# Patient Record
Sex: Male | Born: 1965
Health system: Southern US, Community
[De-identification: ages and names within clinical notes are randomized; demographics above are authoritative.]

## PROBLEM LIST (undated history)

## (undated) DIAGNOSIS — N200 Calculus of kidney: Secondary | ICD-10-CM

## (undated) DIAGNOSIS — K219 Gastro-esophageal reflux disease without esophagitis: Secondary | ICD-10-CM

## (undated) HISTORY — DX: Gastro-esophageal reflux disease without esophagitis: K21.9

## (undated) HISTORY — DX: Calculus of kidney: N20.0

## (undated) HISTORY — PX: OTHER SURGICAL HISTORY: SHX169

---

## 2001-02-17 ENCOUNTER — Other Ambulatory Visit: Admission: RE | Admit: 2001-02-17 | Discharge: 2001-02-17 | Payer: Self-pay | Admitting: Urology

## 2001-11-01 ENCOUNTER — Encounter: Payer: Self-pay | Admitting: Internal Medicine

## 2001-11-01 ENCOUNTER — Emergency Department (HOSPITAL_COMMUNITY): Admission: EM | Admit: 2001-11-01 | Discharge: 2001-11-01 | Payer: Self-pay | Admitting: Internal Medicine

## 2002-02-23 ENCOUNTER — Ambulatory Visit (HOSPITAL_COMMUNITY): Admission: RE | Admit: 2002-02-23 | Discharge: 2002-02-23 | Payer: Self-pay | Admitting: Family Medicine

## 2002-02-23 ENCOUNTER — Encounter: Payer: Self-pay | Admitting: Family Medicine

## 2011-06-14 ENCOUNTER — Encounter: Payer: Self-pay | Admitting: Gastroenterology

## 2013-03-23 ENCOUNTER — Ambulatory Visit: Payer: Self-pay | Admitting: Family Medicine

## 2013-04-25 ENCOUNTER — Telehealth: Payer: Self-pay | Admitting: Family Medicine

## 2013-04-25 NOTE — Telephone Encounter (Signed)
Patient would like to know if Dr. Brett Canales would like for him to get blood work before coming in on Friday 04/27/2013.  Please call Patient. Thanks

## 2013-04-27 ENCOUNTER — Ambulatory Visit (INDEPENDENT_AMBULATORY_CARE_PROVIDER_SITE_OTHER): Payer: PRIVATE HEALTH INSURANCE | Admitting: Family Medicine

## 2013-04-27 ENCOUNTER — Encounter: Payer: Self-pay | Admitting: Family Medicine

## 2013-04-27 VITALS — BP 132/94 | Ht 66.0 in | Wt 205.0 lb

## 2013-04-27 DIAGNOSIS — M549 Dorsalgia, unspecified: Secondary | ICD-10-CM

## 2013-04-27 DIAGNOSIS — Z79899 Other long term (current) drug therapy: Secondary | ICD-10-CM

## 2013-04-27 DIAGNOSIS — Z0000 Encounter for general adult medical examination without abnormal findings: Secondary | ICD-10-CM

## 2013-04-27 DIAGNOSIS — Z125 Encounter for screening for malignant neoplasm of prostate: Secondary | ICD-10-CM

## 2013-04-27 MED ORDER — CHLORZOXAZONE 500 MG PO TABS: 500.0000 mg | ORAL_TABLET | Freq: Three times a day (TID) | ORAL | Status: DC | PRN

## 2013-04-27 MED ORDER — DICLOFENAC SODIUM 75 MG PO TBEC: 75.0000 mg | DELAYED_RELEASE_TABLET | Freq: Two times a day (BID) | ORAL | Status: DC

## 2013-04-27 NOTE — Progress Notes (Signed)
  Subjective:    Patient ID: James Barr, male    DOB: [birth year], 47 y.o.   MRN: [id number]  Back Pain This is a new problem. The current episode started more than 1 month ago. The problem has been gradually worsening since onset. The pain is present in the lumbar spine. The pain radiates to the left thigh. The pain is at a severity of 6/10. The pain is moderate. The pain is worse during the night. Treatments tried: advil prn aleave. The treatment provided mild relief.      Review of Systems  Musculoskeletal: Positive for back pain.   ROS otherwise negative     Objective:   Physical Exam  Alert no acute distress. Vitals reviewed. Lungs clear. Heart regular rate and rhythm. Positive paralumbar tenderness to deep palpation negative straight leg raise      Assessment & Plan:  Impression lumbar strain discussed at length. With duration greater than one month in nature will do an x-ray discussed has not had any recent blood work or while medicine exam. Plan Voltaren and chlorzoxazone when necessary. Appropriate x-ray. Local measures discussed. Appropriate blood work. Recheck in month for wellness exam. WSL

## 2013-05-01 DIAGNOSIS — M549 Dorsalgia, unspecified: Secondary | ICD-10-CM | POA: Insufficient documentation

## 2013-05-03 ENCOUNTER — Encounter: Payer: Self-pay | Admitting: *Deleted

## 2013-05-19 LAB — HEPATIC FUNCTION PANEL
ALT: 27 U/L (ref 0–53)
AST: 20 U/L (ref 0–37)
Albumin: 3.9 g/dL (ref 3.5–5.2)
Alkaline Phosphatase: 62 U/L (ref 39–117)
Bilirubin, Direct: 0.1 mg/dL (ref 0.0–0.3)
Indirect Bilirubin: 0.3 mg/dL (ref 0.0–0.9)
Total Bilirubin: 0.4 mg/dL (ref 0.3–1.2)
Total Protein: 6.7 g/dL (ref 6.0–8.3)

## 2013-05-19 LAB — BASIC METABOLIC PANEL
BUN: 15 mg/dL (ref 6–23)
CO2: 24 mEq/L (ref 19–32)
Calcium: 8.6 mg/dL (ref 8.4–10.5)
Chloride: 107 mEq/L (ref 96–112)
Creat: 1.03 mg/dL (ref 0.50–1.35)
Glucose, Bld: 95 mg/dL (ref 70–99)
Potassium: 4.4 mEq/L (ref 3.5–5.3)
Sodium: 137 mEq/L (ref 135–145)

## 2013-05-19 LAB — LIPID PANEL
Cholesterol: 162 mg/dL (ref 0–200)
HDL: 39 mg/dL — ABNORMAL LOW (ref >39)
LDL Cholesterol: 113 mg/dL — ABNORMAL HIGH (ref 0–99)
Total CHOL/HDL Ratio: 4.2 Ratio
Triglycerides: 49 mg/dL (ref <150)
VLDL: 10 mg/dL (ref 0–40)

## 2013-05-20 LAB — PSA: PSA: 0.82 ng/mL (ref <=4.00)

## 2013-05-21 ENCOUNTER — Encounter: Payer: PRIVATE HEALTH INSURANCE | Admitting: Family Medicine

## 2013-05-24 ENCOUNTER — Encounter: Payer: Self-pay | Admitting: Family Medicine

## 2013-05-24 ENCOUNTER — Ambulatory Visit (INDEPENDENT_AMBULATORY_CARE_PROVIDER_SITE_OTHER): Payer: PRIVATE HEALTH INSURANCE | Admitting: Family Medicine

## 2013-05-24 VITALS — BP 130/94 | Ht 66.0 in | Wt 206.0 lb

## 2013-05-24 DIAGNOSIS — Z0000 Encounter for general adult medical examination without abnormal findings: Secondary | ICD-10-CM

## 2013-05-24 DIAGNOSIS — K921 Melena: Secondary | ICD-10-CM

## 2013-05-24 MED ORDER — OMEPRAZOLE 20 MG PO CPDR: 20.0000 mg | DELAYED_RELEASE_CAPSULE | Freq: Every day | ORAL | Status: DC

## 2013-05-24 NOTE — Progress Notes (Signed)
Subjective:    Patient ID: James Barr, male    DOB: [birth year], 47 y.o.   MRN: [id number]  HPI Here today for annual physical.  No concerns.   No exercise. Pos phys work with job'  Results for orders placed in visit on 04/27/13  LIPID PANEL      Result Value Range   Cholesterol 162  0 - 200 mg/dL   Triglycerides 49  <409 mg/dL   HDL 39 (*) >81 mg/dL   Total CHOL/HDL Ratio 4.2     VLDL 10  0 - 40 mg/dL   LDL Cholesterol 191 (*) 0 - 99 mg/dL  HEPATIC FUNCTION PANEL      Result Value Range   Total Bilirubin 0.4  0.3 - 1.2 mg/dL   Bilirubin, Direct 0.1  0.0 - 0.3 mg/dL   Indirect Bilirubin 0.3  0.0 - 0.9 mg/dL   Alkaline Phosphatase 62  39 - 117 U/L   AST 20  0 - 37 U/L   ALT 27  0 - 53 U/L   Total Protein 6.7  6.0 - 8.3 g/dL   Albumin 3.9  3.5 - 5.2 g/dL  BASIC METABOLIC PANEL      Result Value Range   Sodium 137  135 - 145 mEq/L   Potassium 4.4  3.5 - 5.3 mEq/L   Chloride 107  96 - 112 mEq/L   CO2 24  19 - 32 mEq/L   Glucose, Bld 95  70 - 99 mg/dL   BUN 15  6 - 23 mg/dL   Creat 4.78  2.95 - 6.21 mg/dL   Calcium 8.6  8.4 - 30.8 mg/dL  PSA      Result Value Range   PSA 0.82  <=4.00 ng/mL   No prost cancer , fa died fr colon ca    Review of Systems  Constitutional: Negative for fever, activity change and appetite change.  HENT: Negative for congestion, rhinorrhea and neck pain.   Eyes: Negative for discharge.  Respiratory: Negative for cough and wheezing.   Cardiovascular: Negative for chest pain.  Gastrointestinal: Negative for vomiting, abdominal pain and blood in stool.  Genitourinary: Negative for frequency and difficulty urinating.  Skin: Negative for rash.  Allergic/Immunologic: Negative for environmental allergies and food allergies.  Neurological: Negative for weakness and headaches.  Psychiatric/Behavioral: Negative for agitation.       Objective:   Physical Exam  Vitals reviewed. Constitutional: He appears well-developed and  well-nourished.  HENT:  Head: Normocephalic and atraumatic.  Right Ear: External ear normal.  Left Ear: External ear normal.  Nose: Nose normal.  Mouth/Throat: Oropharynx is clear and moist.  Eyes: EOM are normal. Pupils are equal, round, and reactive to light.  Neck: Normal range of motion. Neck supple. No thyromegaly present.  Cardiovascular: Normal rate, regular rhythm and normal heart sounds.   No murmur heard. Pulmonary/Chest: Effort normal and breath sounds normal. No respiratory distress. He has no wheezes.  Abdominal: Soft. Bowel sounds are normal. He exhibits no distension and no mass. There is no tenderness.  Genitourinary: Penis normal.  Musculoskeletal: Normal range of motion. He exhibits no edema.  Lymphadenopathy:    He has no cervical adenopathy.  Neurological: He is alert. He exhibits normal muscle tone.  Skin: Skin is warm and dry. No erythema.  Psychiatric: He has a normal mood and affect. His behavior is normal. Judgment normal.          Assessment & Plan:  Impression #1 wellness exam #2  reflux clinically stable #3 history of hematochezia patient did not followup with GI consult has encouraged at that time. #4 noncompliance with other issues including blood work. Plan GI consultation. Diet exercise discussed in encourage.

## 2013-05-29 ENCOUNTER — Encounter: Payer: Self-pay | Admitting: Gastroenterology

## 2013-06-11 ENCOUNTER — Telehealth: Payer: Self-pay | Admitting: Gastroenterology

## 2013-06-11 ENCOUNTER — Ambulatory Visit: Payer: Self-pay | Admitting: Gastroenterology

## 2013-06-11 ENCOUNTER — Encounter: Payer: Self-pay | Admitting: Gastroenterology

## 2013-06-11 NOTE — Telephone Encounter (Signed)
Pt was a no show

## 2013-06-11 NOTE — Telephone Encounter (Signed)
MAILED LETTER °

## 2013-09-07 ENCOUNTER — Ambulatory Visit (INDEPENDENT_AMBULATORY_CARE_PROVIDER_SITE_OTHER): Payer: PRIVATE HEALTH INSURANCE | Admitting: Family Medicine

## 2013-09-07 ENCOUNTER — Encounter: Payer: Self-pay | Admitting: Family Medicine

## 2013-09-07 VITALS — BP 120/70 | Temp 98.7°F | Ht 66.0 in | Wt 211.5 lb

## 2013-09-07 DIAGNOSIS — B86 Scabies: Secondary | ICD-10-CM

## 2013-09-07 MED ORDER — MALATHION 0.5 % EX LOTN: TOPICAL_LOTION | CUTANEOUS | Status: DC

## 2013-09-07 NOTE — Progress Notes (Signed)
   Subjective:    Patient ID: James Barr, male    DOB: Dec 04, 1965, 47 y.o.   MRN: [id number]  Rash This is a new problem. The current episode started 1 to 4 weeks ago. The problem has been gradually worsening since onset. The affected locations include the back, right arm and left arm. The rash is characterized by redness and itchiness. He was exposed to nothing. Treatments tried: gold bond. The treatment provided no relief.   At times he feels like his skin is itching and crawling. No prior troubles has not been anywhere  Review of Systems  Skin: Positive for rash.       Objective:   Physical Exam On examination there is multiple areas of excoriation and hyperpigmentation many of these are in linear lines is mainly on the arms hands upper shoulders upper chest some on the upper back and lower back. None on the legs.       Assessment & Plan:  Probable scabies treat with Ovide. Prescription given for his wife is well if necessary repeat in 2 weeks if ongoing troubles then the next step is calling for a dermatology consult

## 2013-09-07 NOTE — Patient Instructions (Signed)
Appears like skin mites (scabies)  Use ovide, may repeat in 1 week  If still with problems then let us know (next step dermatology referral)

## 2014-06-21 ENCOUNTER — Other Ambulatory Visit: Payer: Self-pay | Admitting: Family Medicine

## 2014-07-05 ENCOUNTER — Encounter: Payer: Self-pay | Admitting: Nurse Practitioner

## 2014-07-05 ENCOUNTER — Ambulatory Visit (INDEPENDENT_AMBULATORY_CARE_PROVIDER_SITE_OTHER): Payer: PRIVATE HEALTH INSURANCE | Admitting: Nurse Practitioner

## 2014-07-05 VITALS — BP 120/82 | Ht 66.0 in | Wt 207.0 lb

## 2014-07-05 DIAGNOSIS — R079 Chest pain, unspecified: Secondary | ICD-10-CM

## 2014-07-05 DIAGNOSIS — M7702 Medial epicondylitis, left elbow: Secondary | ICD-10-CM

## 2014-07-05 DIAGNOSIS — K2100 Gastro-esophageal reflux disease with esophagitis, without bleeding: Secondary | ICD-10-CM

## 2014-07-05 DIAGNOSIS — M7712 Lateral epicondylitis, left elbow: Secondary | ICD-10-CM

## 2014-07-05 MED ORDER — OMEPRAZOLE 20 MG PO CPDR: DELAYED_RELEASE_CAPSULE | ORAL | Status: DC

## 2014-07-05 NOTE — Patient Instructions (Signed)
Medial Epicondylitis (Golfer's Elbow) with Rehab Medial epicondylitis involves inflammation and pain around the inner (medial) portion of the elbow. This pain is caused by inflammation of the tendons in the forearm that flex (bring down) the wrist. Medial epicondylitis is also called golfer's elbow, because it is common among golfers. However, it may occur in any individual who flexes the wrist regularly. If medial epicondylitis is left untreated, it may become a chronic problem. SYMPTOMS   Pain, tenderness, or inflammation over the inner (medial) side of the elbow.  Pain or weakness with gripping activities.  Pain that increases with wrist twisting motions (using a screwdriver, playing golf, bowling). CAUSES  Medial epicondylitis is caused by inflammation of the tendons that flex the wrist. Causes of injury may include:  Chronic, repetitive stress and strain to the tendons that run from the wrist and forearm to the elbow.  Sudden strain on the forearm, including wrist snap when serving balls with racquet sports, or throwing a baseball. RISK INCREASES WITH:  Sports or occupations that require repetitive and/or strenuous forearm and wrist movements (pitching a baseball, golfing, carpentry).  Poor wrist and forearm strength and flexibility.  Failure to warm up properly before activity.  Resuming activity before healing, rehabilitation, and conditioning are complete. PREVENTION   Warm up and stretch properly before activity.  Maintain physical fitness:  Strength, flexibility, and endurance.  Cardiovascular fitness.  Wear and use properly fitted equipment.  Learn and use proper technique and have a coach correct improper technique.  Wear a tennis elbow (counterforce) brace. PROGNOSIS  The course of this condition depends on the degree of the injury. If treated properly, acute cases (symptoms lasting less than 4 weeks) are often resolved in 2 to 6 weeks. Chronic (longer lasting  cases) often resolve in 3 to 6 months, but may require physical therapy. RELATED COMPLICATIONS   Frequently recurring symptoms, resulting in a chronic problem. Properly treating the problem the first time decreases frequency of recurrence.  Chronic inflammation, scarring, and partial tendon tear, requiring surgery.  Delayed healing or resolution of symptoms. TREATMENT  Treatment first involves the use of ice and medicine, to reduce pain and inflammation. Strengthening and stretching exercises may reduce discomfort, if performed regularly. These exercises may be performed at home, if the condition is an acute injury. Chronic cases may require a referral to a physical therapist for evaluation and treatment. Your caregiver may advise a corticosteroid injection to help reduce inflammation. Rarely, surgery is needed. MEDICATION  If pain medicine is needed, nonsteroidal anti-inflammatory medicines (aspirin and ibuprofen), or other minor pain relievers (acetaminophen), are often advised.  Do not take pain medicine for 7 days before surgery.  Prescription pain relievers may be given, if your caregiver thinks they are needed. Use only as directed and only as much as you need.  Corticosteroid injections may be recommended. These injections should be reserved only for the most severe cases, because they can only be given a certain number of times. HEAT AND COLD  Cold treatment (icing) should be applied for 10 to 15 minutes every 2 to 3 hours for inflammation and pain, and immediately after activity that aggravates your symptoms. Use ice packs or an ice massage.  Heat treatment may be used before performing stretching and strengthening activities prescribed by your caregiver, physical therapist, or athletic trainer. Use a heat pack or a warm water soak. SEEK MEDICAL CARE IF: Symptoms get worse or do not improve in 2 weeks, despite treatment. EXERCISES  RANGE OF MOTION (  ROM) AND STRETCHING EXERCISES -  Epicondylitis, Medial (Golfer's Elbow) These exercises may help you when beginning to rehabilitate your injury. Your symptoms may go away with or without further involvement from your physician, physical therapist or athletic trainer. While completing these exercises, remember:   Restoring tissue flexibility helps normal motion to return to the joints. This allows healthier, less painful movement and activity.  An effective stretch should be held for at least 30 seconds.  A stretch should never be painful. You should only feel a gentle lengthening or release in the stretched tissue. RANGE OF MOTION - Wrist Flexion, Active-Assisted  Extend your right / left elbow with your fingers pointing down.*  Gently pull the back of your hand towards you, until you feel a gentle stretch on the top of your forearm.  Hold this position for __________ seconds. Repeat __________ times. Complete this exercise __________ times per day.  *If directed by your physician, physical therapist or athletic trainer, complete this stretch with your elbow bent, rather than extended. RANGE OF MOTION - Wrist Extension, Active-Assisted  Extend your right / left elbow and turn your palm upwards.*  Gently pull your palm and fingertips back, so your wrist extends and your fingers point more toward the ground.  You should feel a gentle stretch on the inside of your forearm.  Hold this position for __________ seconds. Repeat __________ times. Complete this exercise __________ times per day. *If directed by your physician, physical therapist or athletic trainer, complete this stretch with your elbow bent, rather than extended. STRETCH - Wrist Extension   Place your right / left fingertips on a tabletop leaving your elbow slightly bent. Your fingers should point backwards.  Gently press your fingers and palm down onto the table, by straightening your elbow. You should feel a stretch on the inside of your forearm.  Hold  this position for __________ seconds. Repeat __________ times. Complete this stretch __________ times per day.  STRENGTHENING EXERCISES - Epicondylitis, Medial (Golfer's Elbow) These exercises may help you when beginning to rehabilitate your injury. They may resolve your symptoms with or without further involvement from your physician, physical therapist or athletic trainer. While completing these exercises, remember:   Muscles can gain both the endurance and the strength needed for everyday activities through controlled exercises.  Complete these exercises as instructed by your physician, physical therapist or athletic trainer. Increase the resistance and repetitions only as guided.  You may experience muscle soreness or fatigue, but the pain or discomfort you are trying to eliminate should never worsen during these exercises. If this pain does get worse, stop and make sure you are following the directions exactly. If the pain is still present after adjustments, discontinue the exercise until you can discuss the trouble with your caregiver. STRENGTH - Wrist Flexors  Sit with your right / left forearm palm-up, and fully supported on a table or countertop. Your elbow should be resting below the height of your shoulder. Allow your wrist to extend over the edge of the surface.  Loosely holding a __________ weight, or a piece of rubber exercise band or tubing, slowly curl your hand up toward your forearm.  Hold this position for __________ seconds. Slowly lower the wrist back to the starting position in a controlled manner. Repeat __________ times. Complete this exercise __________ times per day.  STRENGTH - Wrist Extensors  Sit with your right / left forearm palm-down and fully supported. Your elbow should be resting below the height of your shoulder.   Allow your wrist to extend over the edge of the surface.  Loosely holding a __________ weight, or a piece of rubber exercise band or tubing, slowly  curl your hand up toward your forearm.  Hold this position for __________ seconds. Slowly lower the wrist back to the starting position in a controlled manner. Repeat __________ times. Complete this exercise __________ times per day.  STRENGTH - Ulnar Deviators  Stand with a ____________________ weight in your right / left hand, or sit while holding a rubber exercise band or tubing, with your healthy arm supported on a table or countertop.  Move your wrist so that your pinkie travels toward your forearm and your thumb moves away from your forearm.  Hold this position for __________ seconds and then slowly lower the wrist back to the starting position. Repeat __________ times. Complete this exercise __________ times per day STRENGTH - Grip   Grasp a tennis ball, a dense sponge, or a large, rolled sock in your hand.  Squeeze as hard as you can, without increasing any pain.  Hold this position for __________ seconds. Release your grip slowly. Repeat __________ times. Complete this exercise __________ times per day.  STRENGTH - Forearm Supinators   Sit with your right / left forearm supported on a table, keeping your elbow below shoulder height. Rest your hand over the edge, palm down.  Gently grip a hammer or a soup ladle.  Without moving your elbow, slowly turn your palm and hand upward to a "thumbs-up" position.  Hold this position for __________ seconds. Slowly return to the starting position. Repeat __________ times. Complete this exercise __________ times per day.  STRENGTH - Forearm Pronators  Sit with your right / left forearm supported on a table, keeping your elbow below shoulder height. Rest your hand over the edge, palm up.  Gently grip a hammer or a soup ladle.  Without moving your elbow, slowly turn your palm and hand upward to a "thumbs-up" position.  Hold this position for __________ seconds. Slowly return to the starting position. Repeat __________ times. Complete  this exercise __________ times per day.  Document Released: 08/30/2005 Document Revised: 11/22/2011 Document Reviewed: 12/12/2008 [facility] Patient Information 2015 [place] [place], [place]. This information is not intended to replace advice given to you by your health care provider. Make sure you discuss any questions you have with your health care provider. Lateral Epicondylitis (Tennis Elbow) with Rehab Lateral epicondylitis involves inflammation and pain around the outer portion of the elbow. The pain is caused by inflammation of the tendons in the forearm that bring back (extend) the wrist. Lateral epicondylitis is also called tennis elbow, because it is very common in tennis players. However, it may occur in any individual who extends the wrist repetitively. If lateral epicondylitis is left untreated, it may become a chronic problem. SYMPTOMS   Pain, tenderness, and inflammation on the outer (lateral) side of the elbow.  Pain or weakness with gripping activities.  Pain that increases with wrist-twisting motions (playing tennis, using a screwdriver, opening a door or a jar).  Pain with lifting objects, including a coffee cup. CAUSES  Lateral epicondylitis is caused by inflammation of the tendons that extend the wrist. Causes of injury may include:  Repetitive stress and strain on the muscles and tendons that extend the wrist.  Sudden change in activity level or intensity.  Incorrect grip in racquet sports.  Incorrect grip size of racquet (often too large).  Incorrect hitting position or technique (usually backhand, leading with the  elbow).  Using a racket that is too heavy. RISK INCREASES WITH:  Sports or occupations that require repetitive and/or strenuous forearm and wrist movements (tennis, squash, racquetball, carpentry).  Poor wrist and forearm strength and flexibility.  Failure to warm up properly before activity.  Resuming activity before healing, rehabilitation, and  conditioning are complete. PREVENTION   Warm up and stretch properly before activity.  Maintain physical fitness:  Strength, flexibility, and endurance.  Cardiovascular fitness.  Wear and use properly fitted equipment.  Learn and use proper technique and have a coach correct improper technique.  Wear a tennis elbow (counterforce) brace. PROGNOSIS  The course of this condition depends on the degree of the injury. If treated properly, acute cases (symptoms lasting less than 4 weeks) are often resolved in 2 to 6 weeks. Chronic (longer lasting cases) often resolve in 3 to 6 months but may require physical therapy. RELATED COMPLICATIONS   Frequently recurring symptoms, resulting in a chronic problem. Properly treating the problem the first time decreases frequency of recurrence.  Chronic inflammation, scarring tendon degeneration, and partial tendon tear, requiring surgery.  Delayed healing or resolution of symptoms. TREATMENT  Treatment first involves the use of ice and medicine to reduce pain and inflammation. Strengthening and stretching exercises may help reduce discomfort if performed regularly. These exercises may be performed at home if the condition is an acute injury. Chronic cases may require a referral to a physical therapist for evaluation and treatment. Your caregiver may advise a corticosteroid injection to help reduce inflammation. Rarely, surgery is needed. MEDICATION  If pain medicine is needed, nonsteroidal anti-inflammatory medicines (aspirin and ibuprofen), or other minor pain relievers (acetaminophen), are often advised.  Do not take pain medicine for 7 days before surgery.  Prescription pain relievers may be given, if your caregiver thinks they are needed. Use only as directed and only as much as you need.  Corticosteroid injections may be recommended. These injections should be reserved only for the most severe cases, because they can only be given a certain  number of times. HEAT AND COLD  Cold treatment (icing) should be applied for 10 to 15 minutes every 2 to 3 hours for inflammation and pain, and immediately after activity that aggravates your symptoms. Use ice packs or an ice massage.  Heat treatment may be used before performing stretching and strengthening activities prescribed by your caregiver, physical therapist, or athletic trainer. Use a heat pack or a warm water soak. SEEK MEDICAL CARE IF: Symptoms get worse or do not improve in 2 weeks, despite treatment. EXERCISES  RANGE OF MOTION (ROM) AND STRETCHING EXERCISES - Epicondylitis, Lateral (Tennis Elbow) These exercises may help you when beginning to rehabilitate your injury. Your symptoms may go away with or without further involvement from your physician, physical therapist, or athletic trainer. While completing these exercises, remember:   Restoring tissue flexibility helps normal motion to return to the joints. This allows healthier, less painful movement and activity.  An effective stretch should be held for at least 30 seconds.  A stretch should never be painful. You should only feel a gentle lengthening or release in the stretched tissue. RANGE OF MOTION - Wrist Flexion, Active-Assisted  Extend your right / left elbow with your fingers pointing down.*  Gently pull the back of your hand towards you, until you feel a gentle stretch on the top of your forearm.  Hold this position for __________ seconds. Repeat __________ times. Complete this exercise __________ times per day.  *If directed  by your physician, physical therapist or athletic trainer, complete this stretch with your elbow bent, rather than extended. RANGE OF MOTION - Wrist Extension, Active-Assisted  Extend your right / left elbow and turn your palm upwards.*  Gently pull your palm and fingertips back, so your wrist extends and your fingers point more toward the ground.  You should feel a gentle stretch on the  inside of your forearm.  Hold this position for __________ seconds. Repeat __________ times. Complete this exercise __________ times per day. *If directed by your physician, physical therapist or athletic trainer, complete this stretch with your elbow bent, rather than extended. STRETCH - Wrist Flexion  Place the back of your right / left hand on a tabletop, leaving your elbow slightly bent. Your fingers should point away from your body.  Gently press the back of your hand down onto the table by straightening your elbow. You should feel a stretch on the top of your forearm.  Hold this position for __________ seconds. Repeat __________ times. Complete this stretch __________ times per day.  STRETCH - Wrist Extension   Place your right / left fingertips on a tabletop, leaving your elbow slightly bent. Your fingers should point backwards.  Gently press your fingers and palm down onto the table by straightening your elbow. You should feel a stretch on the inside of your forearm.  Hold this position for __________ seconds. Repeat __________ times. Complete this stretch __________ times per day.  STRENGTHENING EXERCISES - Epicondylitis, Lateral (Tennis Elbow) These exercises may help you when beginning to rehabilitate your injury. They may resolve your symptoms with or without further involvement from your physician, physical therapist, or athletic trainer. While completing these exercises, remember:   Muscles can gain both the endurance and the strength needed for everyday activities through controlled exercises.  Complete these exercises as instructed by your physician, physical therapist or athletic trainer. Increase the resistance and repetitions only as guided.  You may experience muscle soreness or fatigue, but the pain or discomfort you are trying to eliminate should never worsen during these exercises. If this pain does get worse, stop and make sure you are following the directions  exactly. If the pain is still present after adjustments, discontinue the exercise until you can discuss the trouble with your caregiver. STRENGTH - Wrist Flexors  Sit with your right / left forearm palm-up and fully supported on a table or countertop. Your elbow should be resting below the height of your shoulder. Allow your wrist to extend over the edge of the surface.  Loosely holding a __________ weight, or a piece of rubber exercise band or tubing, slowly curl your hand up toward your forearm.  Hold this position for __________ seconds. Slowly lower the wrist back to the starting position in a controlled manner. Repeat __________ times. Complete this exercise __________ times per day.  STRENGTH - Wrist Extensors  Sit with your right / left forearm palm-down and fully supported on a table or countertop. Your elbow should be resting below the height of your shoulder. Allow your wrist to extend over the edge of the surface.  Loosely holding a __________ weight, or a piece of rubber exercise band or tubing, slowly curl your hand up toward your forearm.  Hold this position for __________ seconds. Slowly lower the wrist back to the starting position in a controlled manner. Repeat __________ times. Complete this exercise __________ times per day.  STRENGTH - Ulnar Deviators  Stand with a ____________________ weight in your  right / left hand, or sit while holding a rubber exercise band or tubing, with your healthy arm supported on a table or countertop.  Move your wrist, so that your pinkie travels toward your forearm and your thumb moves away from your forearm.  Hold this position for __________ seconds and then slowly lower the wrist back to the starting position. Repeat __________ times. Complete this exercise __________ times per day STRENGTH - Radial Deviators  Stand with a ____________________ weight in your right / left hand, or sit while holding a rubber exercise band or tubing, with  your injured arm supported on a table or countertop.  Raise your hand upward in front of you or pull up on the rubber tubing.  Hold this position for __________ seconds and then slowly lower the wrist back to the starting position. Repeat __________ times. Complete this exercise __________ times per day. STRENGTH - Forearm Supinators   Sit with your right / left forearm supported on a table, keeping your elbow below shoulder height. Rest your hand over the edge, palm down.  Gently grip a hammer or a soup ladle.  Without moving your elbow, slowly turn your palm and hand upward to a "thumbs-up" position.  Hold this position for __________ seconds. Slowly return to the starting position. Repeat __________ times. Complete this exercise __________ times per day.  STRENGTH - Forearm Pronators   Sit with your right / left forearm supported on a table, keeping your elbow below shoulder height. Rest your hand over the edge, palm up.  Gently grip a hammer or a soup ladle.  Without moving your elbow, slowly turn your palm and hand upward to a "thumbs-up" position.  Hold this position for __________ seconds. Slowly return to the starting position. Repeat __________ times. Complete this exercise __________ times per day.  STRENGTH - Grip  Grasp a tennis ball, a dense sponge, or a large, rolled sock in your hand.  Squeeze as hard as you can, without increasing any pain.  Hold this position for __________ seconds. Release your grip slowly. Repeat __________ times. Complete this exercise __________ times per day.  STRENGTH - Elbow Extensors, Isometric  Stand or sit upright, on a firm surface. Place your right / left arm so that your palm faces your stomach, and it is at the height of your waist.  Place your opposite hand on the underside of your forearm. Gently push up as your right / left arm resists. Push as hard as you can with both arms, without causing any pain or movement at your right /  left elbow. Hold this stationary position for __________ seconds. Gradually release the tension in both arms. Allow your muscles to relax completely before repeating. Document Released: 08/30/2005 Document Revised: 01/14/2014 Document Reviewed: 12/12/2008 [facility] Patient Information 2015 [place], [place]. This information is not intended to replace advice given to you by your health care provider. Make sure you discuss any questions you have with your health care provider.

## 2014-07-10 ENCOUNTER — Encounter: Payer: Self-pay | Admitting: Nurse Practitioner

## 2014-07-10 NOTE — Progress Notes (Signed)
Subjective:  Presents for c/o pain in the left arm for several months. No specific history of injury. 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 No shoulder or neck pain. Right hand dominant. Notices pain at left elbow with golfing. Slight weakness and numbness at times. Arm will occasionally "fall asleep" at night. Pain worse with pulling with left arm. Also c/o localized lower left anterior chest wall pain. Last episode 2 days ago. Unassociated with meals or activity. Can last minutes to hours. Occasional SOB. No family history of heart disease. Has reflux and epigastric pain. Drinks large amount of caffeine. Smokes rare cigar. Some social alcohol. Was on daily Aleve, none x 2 weeks.   Objective:   BP 120/82  Ht 5\' 6"  (1.676 m)  Wt 207 lb (93.895 kg)  BMI 33.43 kg/m2 NAD. Alert, oriented. Lungs clear. Heart RRR. No murmur or gallop noted. Lower extremities no edema. Abdomen soft, non distended, moderate tenderness in epigastric area. ECG: normal; no acute changes. Good ROM of neck , left shoulder and wrist without tenderness. Tenderness noted at medical and lateral epicondyle left elbow. Hand strength 5+ bilat. See lipid profile 05/19/13.  Assessment:  Gastroesophageal reflux disease with esophagitis  Chest pain, unspecified chest pain type - Plan: EKG 12-Lead, EKG 12-Lead  Epicondylitis elbow, medial, left  Epicondylitis, lateral humeral, left  Plan:  Meds ordered this encounter  Medications  . omeprazole (PRILOSEC) 20 MG capsule    Sig: One po BID for acid reflux    Dispense:  60 capsule    Refill:  5    Order Specific Question:  Supervising Provider    Answer:  Riccardo DubinLUKING, WILLIAM S [[id number]]   Given written and verbal information on reflux. Slowly wean off caffeine. Avoid tobacco and NSAID use. Given information on epicondylitis. Take daily EC ASA 81 mg if no worsening of reflux. Will refer to cardiology for evaluation. Reviewed warning signs, seek help immediately if needed. Return if symptoms worsen or  fail to improve.

## 2014-07-12 ENCOUNTER — Ambulatory Visit: Payer: Self-pay | Admitting: Cardiology

## 2014-07-30 ENCOUNTER — Encounter: Payer: Self-pay | Admitting: *Deleted

## 2014-07-30 ENCOUNTER — Encounter: Payer: Self-pay | Admitting: Cardiology

## 2014-07-30 DIAGNOSIS — R072 Precordial pain: Secondary | ICD-10-CM | POA: Insufficient documentation

## 2014-07-30 NOTE — Progress Notes (Signed)
NNo-show. This encounter was created in error - please disregard. 

## 2014-08-01 ENCOUNTER — Telehealth: Payer: Self-pay | Admitting: *Deleted

## 2014-08-01 NOTE — Telephone Encounter (Signed)
Pt notified. Report to be scanned.

## 2014-08-01 NOTE — Telephone Encounter (Signed)
[facility] to let [facility] know bloodwork results. See lab report.

## 2014-09-11 ENCOUNTER — Encounter: Payer: Self-pay | Admitting: *Deleted

## 2014-12-23 ENCOUNTER — Encounter: Payer: Self-pay | Admitting: Nurse Practitioner

## 2014-12-23 ENCOUNTER — Encounter: Payer: Self-pay | Admitting: Family Medicine

## 2014-12-23 ENCOUNTER — Ambulatory Visit (INDEPENDENT_AMBULATORY_CARE_PROVIDER_SITE_OTHER): Payer: PRIVATE HEALTH INSURANCE | Admitting: Nurse Practitioner

## 2014-12-23 VITALS — BP 128/88 | Temp 98.2°F | Ht 66.5 in | Wt 206.0 lb

## 2014-12-23 DIAGNOSIS — J029 Acute pharyngitis, unspecified: Secondary | ICD-10-CM

## 2014-12-23 DIAGNOSIS — J111 Influenza due to unidentified influenza virus with other respiratory manifestations: Secondary | ICD-10-CM | POA: Diagnosis not present

## 2014-12-23 LAB — POCT RAPID STREP A (OFFICE): Rapid Strep A Screen: NEGATIVE

## 2014-12-24 LAB — STREP A DNA PROBE: Strep Gp A Direct, DNA Probe: NEGATIVE

## 2014-12-25 ENCOUNTER — Encounter: Payer: Self-pay | Admitting: Family Medicine

## 2014-12-25 NOTE — Progress Notes (Signed)
Card sent 

## 2014-12-27 NOTE — Progress Notes (Signed)
Subjective:  Presents for c/o fever, body aches and headache x 3 days. Temp 101 this am. Better with Advil. Sore throat. Runny nose. Coughing. bilat ear pain. No wheezing. No vomiting, diarrhea or abd pain. Taking fluids well.  Objective:   BP 128/88 mmHg  Temp(Src) 98.2 F (36.8 C)  Ht 5' 6.5" (1.689 m)  Wt 206 lb (93.441 kg)  BMI 32.76 kg/m2 NAD. Alert, oriented. TMs clear effusion. Pharynx moderate erythema, RST neg. Neck supple with mild anterior adenopathy. Lungs clear. Heart RRR.   Assessment: Influenza  Acute pharyngitis, unspecified pharyngitis type - Plan: Strep A DNA probe, POCT rapid strep A  Plan: reviewed symptomatic care and warning signs. Call back by end of week if no improvement, sooner if worse.

## 2015-07-08 ENCOUNTER — Telehealth: Payer: Self-pay | Admitting: Family Medicine

## 2015-07-08 MED ORDER — OMEPRAZOLE 20 MG PO CPDR: DELAYED_RELEASE_CAPSULE | ORAL | Status: DC

## 2015-07-08 NOTE — Telephone Encounter (Signed)
Rx sent electronically to pharmacy. Patient notified. 

## 2015-07-08 NOTE — Telephone Encounter (Signed)
omeprazole (PRILOSEC) 20 MG capsule  Pt has appt for the 3rd but he is out of this med an needs  A refill please   [facility]

## 2015-07-17 ENCOUNTER — Encounter: Payer: Self-pay | Admitting: Family Medicine

## 2015-07-17 ENCOUNTER — Ambulatory Visit (INDEPENDENT_AMBULATORY_CARE_PROVIDER_SITE_OTHER): Payer: PRIVATE HEALTH INSURANCE | Admitting: Family Medicine

## 2015-07-17 VITALS — BP 124/80 | Ht 66.5 in | Wt 210.0 lb

## 2015-07-17 DIAGNOSIS — K2100 Gastro-esophageal reflux disease with esophagitis, without bleeding: Secondary | ICD-10-CM

## 2015-07-17 DIAGNOSIS — E785 Hyperlipidemia, unspecified: Secondary | ICD-10-CM | POA: Diagnosis not present

## 2015-07-17 DIAGNOSIS — G5602 Carpal tunnel syndrome, left upper limb: Secondary | ICD-10-CM | POA: Diagnosis not present

## 2015-07-17 MED ORDER — OMEPRAZOLE 20 MG PO CPDR: DELAYED_RELEASE_CAPSULE | ORAL | Status: DC

## 2015-07-17 NOTE — Progress Notes (Signed)
   Subjective:    Patient ID: Cristal GenerousRoger D Taddeo, male    DOB: 08/07/1966, 49 y.o.   MRN: [id number]  HPI Patient is here today for a follow up visit on GERD. Patient is doing very well. Patient has no concerns at this time.  Bumping it up to two doses helped a   Watching caffeine intake, has cut down sidgnificant;ly  Flu vaccine- declined  Left hand , tingling at time, does [employer] work, off and on. Tingling worse at night.  Pt is right handed  Hx of borderline chol, states numbers are now sworse, due to bring in. History of elevation in past Review of Systems No headache no chest pain no back pain ROS otherwise negative    Objective:   Physical Exam Alert vital stable HET normal lungs clear heart regular rhythm abdomen benign left arm strength intact sensation currently intact pulses good. Positive Phalen's sign       Assessment & Plan:  Impression 1 reflux with ongoing need for meds discussed #2 left arm carpal tunnel syndrome discussed interventions recommended #3 hyperlipidemia status uncertain discuss plan patient to bring in blood work will reviewed. Initiate nighttime splints. Refill omeprazole diet exercise discussed 25 minutes spent most in discussion W[facility]

## 2015-07-18 ENCOUNTER — Telehealth: Payer: Self-pay | Admitting: Family Medicine

## 2015-07-18 NOTE — Telephone Encounter (Signed)
See pts labs results from his Work in [employer] folder

## 2015-09-16 ENCOUNTER — Encounter: Payer: PRIVATE HEALTH INSURANCE | Admitting: Family Medicine

## 2015-10-01 ENCOUNTER — Encounter: Payer: PRIVATE HEALTH INSURANCE | Admitting: Family Medicine

## 2015-10-01 DIAGNOSIS — Z029 Encounter for administrative examinations, unspecified: Secondary | ICD-10-CM

## 2016-08-26 ENCOUNTER — Other Ambulatory Visit: Payer: Self-pay | Admitting: Family Medicine

## 2016-08-26 MED ORDER — OMEPRAZOLE 20 MG PO CPDR: DELAYED_RELEASE_CAPSULE | ORAL | 0 refills | Status: DC

## 2016-08-26 NOTE — Telephone Encounter (Signed)
Patient has physical on 1/8 and he needs [facility] [place]

## 2016-08-26 NOTE — Telephone Encounter (Signed)
Left message on voicemail notifying patient that refill was sent to pharmacy.

## 2016-09-20 ENCOUNTER — Encounter: Payer: Self-pay | Admitting: Family Medicine

## 2016-09-20 ENCOUNTER — Ambulatory Visit (INDEPENDENT_AMBULATORY_CARE_PROVIDER_SITE_OTHER): Payer: Self-pay | Admitting: Family Medicine

## 2016-09-20 VITALS — BP 118/76 | Ht 66.5 in | Wt 210.4 lb

## 2016-09-20 DIAGNOSIS — Z79899 Other long term (current) drug therapy: Secondary | ICD-10-CM

## 2016-09-20 DIAGNOSIS — Z131 Encounter for screening for diabetes mellitus: Secondary | ICD-10-CM

## 2016-09-20 DIAGNOSIS — Z13220 Encounter for screening for lipoid disorders: Secondary | ICD-10-CM

## 2016-09-20 DIAGNOSIS — Z0000 Encounter for general adult medical examination without abnormal findings: Secondary | ICD-10-CM

## 2016-09-20 DIAGNOSIS — Z111 Encounter for screening for respiratory tuberculosis: Secondary | ICD-10-CM

## 2016-09-20 DIAGNOSIS — Z125 Encounter for screening for malignant neoplasm of prostate: Secondary | ICD-10-CM

## 2016-09-20 MED ORDER — OMEPRAZOLE 20 MG PO CPDR: DELAYED_RELEASE_CAPSULE | ORAL | 5 refills | Status: DC

## 2016-09-20 NOTE — Progress Notes (Signed)
   Subjective:    Patient ID: James Barr, male    DOB: 07/29/1966, 51 y.o.   MRN: [id number]  HPI  The patient comes in today for a wellness visit.  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  A review of medications was also completed.  Any needed refills; yes  Eating habits: eating healthy  Falls/  MVA accidents in past few months: none  Regular exercise: walk Tries to walk thre days per wk   Specialist pt sees on regular basis: no  Preventative health issues were discussed.   Additional concerns: needs form filled out for work  Fa had rostate cancer    Review of Systems  Constitutional: Negative for activity change, appetite change and fever.  HENT: Negative for congestion and rhinorrhea.   Eyes: Negative for discharge.  Respiratory: Negative for cough and wheezing.   Cardiovascular: Negative for chest pain.  Gastrointestinal: Negative for abdominal pain, blood in stool and vomiting.  Genitourinary: Negative for difficulty urinating and frequency.  Musculoskeletal: Negative for neck pain.  Skin: Negative for rash.  Allergic/Immunologic: Negative for environmental allergies and food allergies.  Neurological: Negative for weakness and headaches.  Psychiatric/Behavioral: Negative for agitation.  All other systems reviewed and are negative.      Objective:   Physical Exam  Constitutional: He appears well-developed and well-nourished.  HENT:  Head: Normocephalic and atraumatic.  Right Ear: External ear normal.  Left Ear: External ear normal.  Nose: Nose normal.  Mouth/Throat: Oropharynx is clear and moist.  Eyes: EOM are normal. Pupils are equal, round, and reactive to light.  Neck: Normal range of motion. Neck supple. No thyromegaly present.  Cardiovascular: Normal rate, regular rhythm and normal heart sounds.   No murmur  heard. Pulmonary/Chest: Effort normal and breath sounds normal. No respiratory distress. He has no wheezes.  Abdominal: Soft. Bowel sounds are normal. He exhibits no distension and no mass. There is no tenderness.  Genitourinary: Penis normal.  Musculoskeletal: Normal range of motion. He exhibits no edema.  Lymphadenopathy:    He has no cervical adenopathy.  Neurological: He is alert. He exhibits normal muscle tone.  Skin: Skin is warm and dry. No erythema.  Psychiatric: He has a normal mood and affect. His behavior is normal. Judgment normal.  Vitals reviewed.         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 Patient has long-standing history of occasional bright red blood per stool. Plan appropriate blood work

## 2016-09-22 ENCOUNTER — Ambulatory Visit: Payer: PRIVATE HEALTH INSURANCE

## 2016-09-22 LAB — TB SKIN TEST
Induration: 0 mm
TB Skin Test: NEGATIVE

## 2016-09-23 ENCOUNTER — Encounter: Payer: Self-pay | Admitting: Family Medicine

## 2018-01-01 ENCOUNTER — Other Ambulatory Visit: Payer: Self-pay | Admitting: Family Medicine

## 2018-04-24 ENCOUNTER — Telehealth: Payer: Self-pay | Admitting: Family Medicine

## 2018-04-24 DIAGNOSIS — K219 Gastro-esophageal reflux disease without esophagitis: Secondary | ICD-10-CM

## 2018-04-24 DIAGNOSIS — Z79899 Other long term (current) drug therapy: Secondary | ICD-10-CM

## 2018-04-24 DIAGNOSIS — Z125 Encounter for screening for malignant neoplasm of prostate: Secondary | ICD-10-CM

## 2018-04-24 DIAGNOSIS — Z13220 Encounter for screening for lipoid disorders: Secondary | ICD-10-CM

## 2018-04-24 NOTE — Telephone Encounter (Signed)
[facility]

## 2018-04-24 NOTE — Telephone Encounter (Signed)
Ok lets do. Let pt know we usually do that as part of the swellness visit--and he is eight months late on yrly well ness. If agrees met 7 liv lip and psa before a wellness

## 2018-04-24 NOTE — Telephone Encounter (Signed)
Please advise 

## 2018-04-24 NOTE — Telephone Encounter (Signed)
Patient is aware of all . He will have labs drawn before office visit with us. He was sent to the front to set up. He is aware we have placed the order for Gastro in Epic.

## 2018-04-25 ENCOUNTER — Encounter: Payer: Self-pay | Admitting: Family Medicine

## 2018-04-27 ENCOUNTER — Encounter: Payer: Self-pay | Admitting: Internal Medicine

## 2018-05-19 LAB — BASIC METABOLIC PANEL
BUN/Creatinine Ratio: 14 (ref 9–20)
BUN: 15 mg/dL (ref 6–24)
CO2: 23 mmol/L (ref 20–29)
Calcium: 9.7 mg/dL (ref 8.7–10.2)
Chloride: 104 mmol/L (ref 96–106)
Creatinine, Ser: 1.05 mg/dL (ref 0.76–1.27)
GFR calc Af Amer: 95 mL/min/{1.73_m2} (ref >59)
GFR calc non Af Amer: 82 mL/min/{1.73_m2} (ref >59)
Glucose: 103 mg/dL — ABNORMAL HIGH (ref 65–99)
Potassium: 4.4 mmol/L (ref 3.5–5.2)
Sodium: 143 mmol/L (ref 134–144)

## 2018-05-19 LAB — HEPATIC FUNCTION PANEL
ALT: 22 IU/L (ref 0–44)
AST: 23 IU/L (ref 0–40)
Albumin: 4.3 g/dL (ref 3.5–5.5)
Alkaline Phosphatase: 72 IU/L (ref 39–117)
Bilirubin Total: 0.5 mg/dL (ref 0.0–1.2)
Bilirubin, Direct: 0.14 mg/dL (ref 0.00–0.40)
Total Protein: 7.1 g/dL (ref 6.0–8.5)

## 2018-05-19 LAB — LIPID PANEL
Chol/HDL Ratio: 5 ratio (ref 0.0–5.0)
Cholesterol, Total: 180 mg/dL (ref 100–199)
HDL: 36 mg/dL — ABNORMAL LOW (ref >39)
LDL Calculated: 127 mg/dL — ABNORMAL HIGH (ref 0–99)
Triglycerides: 85 mg/dL (ref 0–149)
VLDL Cholesterol Cal: 17 mg/dL (ref 5–40)

## 2018-05-19 LAB — PSA: Prostate Specific Ag, Serum: 1.5 ng/mL (ref 0.0–4.0)

## 2018-05-23 ENCOUNTER — Encounter: Payer: Self-pay | Admitting: Family Medicine

## 2018-06-20 ENCOUNTER — Encounter: Payer: Self-pay | Admitting: Family Medicine

## 2018-06-20 ENCOUNTER — Ambulatory Visit (INDEPENDENT_AMBULATORY_CARE_PROVIDER_SITE_OTHER): Payer: BLUE CROSS/BLUE SHIELD | Admitting: Family Medicine

## 2018-06-20 VITALS — BP 118/70 | Ht 66.5 in | Wt 210.8 lb

## 2018-06-20 DIAGNOSIS — Z0000 Encounter for general adult medical examination without abnormal findings: Secondary | ICD-10-CM | POA: Diagnosis not present

## 2018-06-20 DIAGNOSIS — G5600 Carpal tunnel syndrome, unspecified upper limb: Secondary | ICD-10-CM

## 2018-06-20 DIAGNOSIS — R7303 Prediabetes: Secondary | ICD-10-CM | POA: Diagnosis not present

## 2018-06-20 DIAGNOSIS — E785 Hyperlipidemia, unspecified: Secondary | ICD-10-CM | POA: Diagnosis not present

## 2018-06-20 NOTE — Patient Instructions (Signed)
Results for orders placed or performed in visit on 04/24/18  Basic metabolic panel  Result Value Ref Range   Glucose 103 (H) 65 - 99 mg/dL   BUN 15 6 - 24 mg/dL   Creatinine, Ser 1.61 0.76 - 1.27 mg/dL   GFR calc non Af Amer 82 >59 mL/min/1.73   GFR calc Af Amer 95 >59 mL/min/1.73   BUN/Creatinine Ratio 14 9 - 20   Sodium 143 134 - 144 mmol/L   Potassium 4.4 3.5 - 5.2 mmol/L   Chloride 104 96 - 106 mmol/L   CO2 23 20 - 29 mmol/L   Calcium 9.7 8.7 - 10.2 mg/dL  Hepatic function panel  Result Value Ref Range   Total Protein 7.1 6.0 - 8.5 g/dL   Albumin 4.3 3.5 - 5.5 g/dL   Bilirubin Total 0.5 0.0 - 1.2 mg/dL   Bilirubin, Direct 0.96 0.00 - 0.40 mg/dL   Alkaline Phosphatase 72 39 - 117 IU/L   AST 23 0 - 40 IU/L   ALT 22 0 - 44 IU/L  Lipid panel  Result Value Ref Range   Cholesterol, Total 180 100 - 199 mg/dL   Triglycerides 85 0 - 149 mg/dL   HDL 36 (L) >04 mg/dL   VLDL Cholesterol Cal 17 5 - 40 mg/dL   LDL Calculated 540 (H) 0 - 99 mg/dL   Chol/HDL Ratio 5.0 0.0 - 5.0 ratio  PSA  Result Value Ref Range   Prostate Specific Ag, Serum 1.5 0.0 - 4.0 ng/mL       High Cholesterol High cholesterol is a condition in which the blood has high levels of a white, waxy, fat-like substance (cholesterol). The human body needs small amounts of cholesterol. The liver makes all the cholesterol that the body needs. Extra (excess) cholesterol comes from the food that we eat. Cholesterol is carried from the liver by the blood through the blood vessels. If you have high cholesterol, deposits (plaques) may build up on the walls of your blood vessels (arteries). Plaques make the arteries narrower and stiffer. Cholesterol plaques increase your risk for heart attack and stroke. Work with your health care provider to keep your cholesterol levels in a healthy range. What increases the risk? This condition is more likely to develop in people who:  Eat foods that are high in animal fat (saturated fat)  or cholesterol.  Are overweight.  Are not getting enough exercise.  Have a family history of high cholesterol.  What are the signs or symptoms? There are no symptoms of this condition. How is this diagnosed? This condition may be diagnosed from the results of a blood test.  If you are older than age 56, your health care provider may check your cholesterol every 4-6 years.  You may be checked more often if you already have high cholesterol or other risk factors for heart disease.  The blood test for cholesterol measures:  "Bad" cholesterol (LDL cholesterol). This is the main type of cholesterol that causes heart disease. The desired level for LDL is less than 100.  "Good" cholesterol (HDL cholesterol). This type helps to protect against heart disease by cleaning the arteries and carrying the LDL away. The desired level for HDL is 60 or higher.  Triglycerides. These are fats that the body can store or burn for energy. The desired number for triglycerides is lower than 150.  Total cholesterol. This is a measure of the total amount of cholesterol in your blood, including LDL cholesterol, HDL cholesterol, and  triglycerides. A healthy number is less than 200.  How is this treated? This condition is treated with diet changes, lifestyle changes, and medicines. Diet changes  This may include eating more whole grains, fruits, vegetables, nuts, and fish.  This may also include cutting back on red meat and foods that have a lot of added sugar. Lifestyle changes  Changes may include getting at least 40 minutes of aerobic exercise 3 times a week. Aerobic exercises include walking, biking, and swimming. Aerobic exercise along with a healthy diet can help you maintain a healthy weight.  Changes may also include quitting smoking. Medicines  Medicines are usually given if diet and lifestyle changes have failed to reduce your cholesterol to healthy levels.  Your health care provider may  prescribe a statin medicine. Statin medicines have been shown to reduce cholesterol, which can reduce the risk of heart disease. Follow these instructions at home: Eating and drinking  If told by your health care provider:  Eat chicken (without skin), fish, veal, shellfish, ground [place] breast, and round or loin cuts of red meat.  Do not eat fried foods or fatty meats, such as hot dogs and salami.  Eat plenty of fruits, such as apples.  Eat plenty of vegetables, such as broccoli, potatoes, and carrots.  Eat beans, peas, and lentils.  Eat grains such as barley, rice, couscous, and bulgur wheat.  Eat pasta without cream sauces.  Use skim or nonfat milk, and eat low-fat or nonfat yogurt and cheeses.  Do not eat or drink whole milk, cream, ice cream, egg yolks, or hard cheeses.  Do not eat stick margarine or tub margarines that contain trans fats (also called partially hydrogenated oils).  Do not eat saturated tropical oils, such as coconut oil and palm oil.  Do not eat cakes, cookies, crackers, or other baked goods that contain trans fats.  General instructions  Exercise as directed by your health care provider. Increase your activity level with activities such as gardening, walking, and taking the stairs.  Take over-the-counter and prescription medicines only as told by your health care provider.  Do not use any products that contain nicotine or tobacco, such as cigarettes and e-cigarettes. If you need help quitting, ask your health care provider.  Keep all follow-up visits as told by your health care provider. This is important. Contact a health care provider if:  You are struggling to maintain a healthy diet or weight.  You need help to start on an exercise program.  You need help to stop smoking. Get help right away if:  You have chest pain.  You have trouble breathing. This information is not intended to replace advice given to you by your health care provider.  Make sure you discuss any questions you have with your health care provider. Document Released: 08/30/2005 Document Revised: 03/27/2016 Document Reviewed: 02/28/2016 Elsevier Interactive Patient Education  [employer].

## 2018-06-20 NOTE — Progress Notes (Signed)
Subjective:    Patient ID: James Barr, male    DOB: [birth year], 52 y.o.   MRN: [id number]  HPI  The patient comes in today for a wellness visit.    A review of their health history was completed.  A review of medications was also completed.  Any needed refills; none  Eating habits: trying to eat healthy  Falls/  MVA accidents in past few months:none  Regular exercise: go to [employer]  Specialist pt sees on regular basis: none  Preventative health issues were discussed.   Additional concerns: numbness in arms and hands. Worse at night, arms and hands, has been wearing braces, when drives pt goes numb in the hands . 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  Shoulder pain.  Several weeks duration.  Recalls no sudden injury.  Worse with certain movements   Set for colonoscopy next month   Overall trying to eat right, eating salads and such    Twice per week to the y  Results for orders placed or performed in visit on 04/24/18  Basic metabolic panel  Result Value Ref Range   Glucose 103 (H) 65 - 99 mg/dL   BUN 15 6 - 24 mg/dL   Creatinine, Ser 4.09 0.76 - 1.27 mg/dL   GFR calc non Af Amer 82 >59 mL/min/1.73   GFR calc Af Amer 95 >59 mL/min/1.73   BUN/Creatinine Ratio 14 9 - 20   Sodium 143 134 - 144 mmol/L   Potassium 4.4 3.5 - 5.2 mmol/L   Chloride 104 96 - 106 mmol/L   CO2 23 20 - 29 mmol/L   Calcium 9.7 8.7 - 10.2 mg/dL  Hepatic function panel  Result Value Ref Range   Total Protein 7.1 6.0 - 8.5 g/dL   Albumin 4.3 3.5 - 5.5 g/dL   Bilirubin Total 0.5 0.0 - 1.2 mg/dL   Bilirubin, Direct 8.11 0.00 - 0.40 mg/dL   Alkaline Phosphatase 72 39 - 117 IU/L   AST 23 0 - 40 IU/L   ALT 22 0 - 44 IU/L  Lipid panel  Result Value Ref Range   Cholesterol, Total 180 100 - 199 mg/dL   Triglycerides 85 0 - 149 mg/dL   HDL 36 (L) >91 mg/dL   VLDL Cholesterol Cal 17 5 - 40 mg/dL   LDL Calculated 478 (H) 0  - 99 mg/dL   Chol/HDL Ratio 5.0 0.0 - 5.0 ratio  PSA  Result Value Ref Range   Prostate Specific Ag, Serum 1.5 0.0 - 4.0 ng/mL   No coonc ca no prost ca   Pos diab in two brothers Review of Systems  Constitutional: Negative for activity change, appetite change and fever.  HENT: Negative for congestion and rhinorrhea.   Eyes: Negative for discharge.  Respiratory: Negative for cough and wheezing.   Cardiovascular: Negative for chest pain.  Gastrointestinal: Negative for abdominal pain, blood in stool and vomiting.  Genitourinary: Negative for difficulty urinating and frequency.  Musculoskeletal: Negative for neck pain.  Skin: Negative for rash.  Allergic/Immunologic: Negative for environmental allergies and food allergies.  Neurological: Negative for weakness and headaches.  Psychiatric/Behavioral: Negative for agitation.  All other systems reviewed and are negative.      Objective:   Physical Exam  Constitutional: He appears well-developed and well-nourished.  HENT:  Head: Normocephalic and atraumatic.  Right Ear: External ear normal.  Left Ear: External ear normal.  Nose: Nose  normal.  Mouth/Throat: Oropharynx is clear and moist.  Eyes: Pupils are equal, round, and reactive to light. EOM are normal.  Neck: Normal range of motion. Neck supple. No thyromegaly present.  Cardiovascular: Normal rate, regular rhythm and normal heart sounds.  No murmur heard. Pulmonary/Chest: Effort normal and breath sounds normal. No respiratory distress. He has no wheezes.  Abdominal: Soft. Bowel sounds are normal. He exhibits no distension and no mass. There is no tenderness.  Genitourinary: Prostate normal and penis normal.  Musculoskeletal: Normal range of motion. He exhibits no edema.  Lymphadenopathy:    He has no cervical adenopathy.  Neurological: He is alert. He exhibits normal muscle tone.  Skin: Skin is warm and dry. No erythema.  Psychiatric: He has a normal mood and affect. His  behavior is normal. Judgment normal.  Vitals reviewed.  Left shoulder positive impingement sign.  No deformity  Hands sensation currently intact plus minus Phalen's sign.  Grip intact.  Sensory exam intact.      Assessment & Plan:  Impression wellness.  Colonoscopy scheduled soon.  Diet discussed.  Exercise discussed.  Blood work reviewed.  Vaccines discussed.  2.  Carpal tunnel syndrome progressively worsening time for referral discussed rationale.  Using nighttime splints.  3.  Left shoulder impingement sign both we will calm down with time and range of motion exercise discussed  4.  Prediabetes new diagnosis discussed

## 2018-06-26 ENCOUNTER — Encounter: Payer: Self-pay | Admitting: Family Medicine

## 2018-07-28 ENCOUNTER — Ambulatory Visit (INDEPENDENT_AMBULATORY_CARE_PROVIDER_SITE_OTHER): Payer: Self-pay | Admitting: Gastroenterology

## 2018-07-28 ENCOUNTER — Telehealth: Payer: Self-pay

## 2018-07-28 ENCOUNTER — Encounter: Payer: Self-pay | Admitting: Gastroenterology

## 2018-07-28 ENCOUNTER — Other Ambulatory Visit: Payer: Self-pay | Admitting: Gastroenterology

## 2018-07-28 VITALS — BP 125/78 | HR 74 | Temp 97.1°F | Ht 65.5 in | Wt 206.6 lb

## 2018-07-28 DIAGNOSIS — Z1211 Encounter for screening for malignant neoplasm of colon: Secondary | ICD-10-CM

## 2018-07-28 MED ORDER — PEG 3350-KCL-NA BICARB-NACL 420 G PO SOLR: 4000.0000 mL | ORAL | 0 refills | Status: DC

## 2018-07-28 NOTE — Progress Notes (Signed)
Patient presented as new patient referral for "gerd". Patient was actually supposed to have been referred for screening colonoscopy. Office visit was cancelled after discussion with patient. He has no GI complaints and no FH of colon cancer. He was triaged for a colonoscopy per protocol.

## 2018-07-28 NOTE — Telephone Encounter (Addendum)
Gastroenterology Pre-Procedure Review  Request Date:07/28/18 Requesting Physician: Dr.Luking no previous tcs  PATIENT REVIEW QUESTIONS: The patient responded to the following health history questions as indicated:    1. Diabetes Melitis: no 2. Joint replacements in the past 12 months: no 3. Major health problems in the past 3 months: no 4. Has an artificial valve or MVP: no 5. Has a defibrillator: no 6. Has been advised in past to take antibiotics in advance of a procedure like teeth cleaning: no 7. Family history of colon cancer: no  8. Alcohol Use: yes (wine at dinner) 9. History of sleep apnea: no  10. History of coronary artery or other vascular stents placed within the last 12 months: no 11. History of any prior anesthesia complications: no    MEDICATIONS & ALLERGIES:    Patient reports the following regarding taking any blood thinners:   Plavix? no Aspirin? no Coumadin? no Brilinta? no Xarelto? no Eliquis? no Pradaxa? no Savaysa? no Effient? no  Patient confirms/reports the following medications:  Current Outpatient Medications  Medication Sig Dispense Refill  . Multiple Vitamin (MULTIVITAMIN) tablet Take 1 tablet by mouth daily.    Marland Kitchen. omeprazole (PRILOSEC) 20 MG capsule TAKE ONE CAPSULE BY MOUTH TWICE DAILY FOR  ACID  REFLUX 60 capsule 5   No current facility-administered medications for this visit.     Patient confirms/reports the following allergies:  No Known Allergies  No orders of the defined types were placed in this encounter.   AUTHORIZATION INFORMATION Primary Insurance:BCBS  ,  ID #:[id number] Pre-Cert / Auth required:no   SCHEDULE INFORMATION: Procedure has been scheduled as follows:  Date:10/09/18 , Time: 10:30 Location: [facility]  This Gastroenterology Pre-Precedure Review Form is being routed to the following provider(s): Wynne DustEric Gill NP

## 2018-08-03 NOTE — Telephone Encounter (Signed)
Ok to schedule.

## 2018-10-09 ENCOUNTER — Encounter (HOSPITAL_COMMUNITY): Payer: Self-pay | Admitting: *Deleted

## 2018-10-09 ENCOUNTER — Ambulatory Visit (HOSPITAL_COMMUNITY)
Admission: RE | Admit: 2018-10-09 | Discharge: 2018-10-09 | Disposition: A | Discharge status: Home / Self Care | Payer: BLUE CROSS/BLUE SHIELD | Attending: Gastroenterology | Admitting: Gastroenterology

## 2018-10-09 ENCOUNTER — Encounter (HOSPITAL_COMMUNITY)
Admission: RE | Disposition: A | Discharge status: Home / Self Care | Payer: Self-pay | Source: Home / Self Care | Attending: Gastroenterology

## 2018-10-09 ENCOUNTER — Other Ambulatory Visit: Payer: Self-pay

## 2018-10-09 DIAGNOSIS — K5730 Diverticulosis of large intestine without perforation or abscess without bleeding: Secondary | ICD-10-CM | POA: Diagnosis not present

## 2018-10-09 DIAGNOSIS — Q438 Other specified congenital malformations of intestine: Secondary | ICD-10-CM | POA: Diagnosis not present

## 2018-10-09 DIAGNOSIS — Z1211 Encounter for screening for malignant neoplasm of colon: Secondary | ICD-10-CM | POA: Diagnosis present

## 2018-10-09 DIAGNOSIS — Z79899 Other long term (current) drug therapy: Secondary | ICD-10-CM | POA: Insufficient documentation

## 2018-10-09 DIAGNOSIS — F17290 Nicotine dependence, other tobacco product, uncomplicated: Secondary | ICD-10-CM | POA: Insufficient documentation

## 2018-10-09 DIAGNOSIS — K219 Gastro-esophageal reflux disease without esophagitis: Secondary | ICD-10-CM | POA: Diagnosis not present

## 2018-10-09 DIAGNOSIS — K648 Other hemorrhoids: Secondary | ICD-10-CM | POA: Insufficient documentation

## 2018-10-09 HISTORY — PX: COLONOSCOPY: SHX5424

## 2018-10-09 SURGERY — COLONOSCOPY
Anesthesia: Moderate Sedation

## 2018-10-09 MED ORDER — STERILE WATER FOR IRRIGATION IR SOLN
Status: DC | PRN
  Administered 2018-10-09: 11:00:00

## 2018-10-09 MED ORDER — SODIUM CHLORIDE 0.9 % IV SOLN
INTRAVENOUS | Status: DC
  Administered 2018-10-09: 10:00:00 via INTRAVENOUS

## 2018-10-09 MED ORDER — MIDAZOLAM HCL 5 MG/5ML IJ SOLN
INTRAMUSCULAR | Status: DC | PRN
  Administered 2018-10-09 (×4): 2 mg via INTRAVENOUS

## 2018-10-09 MED ORDER — MIDAZOLAM HCL 5 MG/5ML IJ SOLN
INTRAMUSCULAR | Status: AC
  Filled 2018-10-09: qty 10

## 2018-10-09 MED ORDER — MEPERIDINE HCL 100 MG/ML IJ SOLN
INTRAMUSCULAR | Status: AC
  Filled 2018-10-09: qty 2

## 2018-10-09 MED ORDER — MEPERIDINE HCL 100 MG/ML IJ SOLN
INTRAMUSCULAR | Status: DC | PRN
  Administered 2018-10-09 (×4): 25 mg

## 2018-10-09 NOTE — Discharge Instructions (Signed)
You DID NOT HAVE ANY POLYPS. YOU HAVE DIVERTICULOSIS IN YOUR RIGHT COLON. You have SMALL internal hemorrhoids.   DRINK WATER TO KEEP URINE LIGHT YELLOW.  FOLLOW A HIGH FIBER DIET. AVOID ITEMS THAT CAUSE BLOATING & GAS. SEE INFO BELOW.  Next colonoscopy in 10 years.   Colonoscopy Care After Read the instructions outlined below and refer to this sheet in the next week. These discharge instructions provide you with general information on caring for yourself after you leave the hospital. While your treatment has been planned according to the most current medical practices available, unavoidable complications occasionally occur. If you have any problems or questions after discharge, call DR. Kaydin Labo, [phone].  ACTIVITY  You may resume your regular activity, but move at a slower pace for the next 24 hours.   Take frequent rest periods for the next 24 hours.   Walking will help get rid of the air and reduce the bloated feeling in your belly (abdomen).   No driving for 24 hours (because of the medicine (anesthesia) used during the test).   You may shower.   Do not sign any important legal documents or operate any machinery for 24 hours (because of the anesthesia used during the test).    NUTRITION  Drink plenty of fluids.   You may resume your normal diet as instructed by your doctor.   Begin with a light meal and progress to your normal diet. Heavy or fried foods are harder to digest and may make you feel sick to your stomach (nauseated).   Avoid alcoholic beverages for 24 hours or as instructed.    MEDICATIONS  You may resume your normal medications.   WHAT YOU CAN EXPECT TODAY  Some feelings of bloating in the abdomen.   Passage of more gas than usual.   Spotting of blood in your stool or on the toilet paper  .  IF YOU HAD POLYPS REMOVED DURING THE COLONOSCOPY:  Eat a soft diet IF YOU HAVE NAUSEA, BLOATING, ABDOMINAL PAIN, OR VOMITING.    FINDING OUT THE  RESULTS OF YOUR TEST Not all test results are available during your visit. DR. Darrick Penna WILL CALL YOU WITHIN 7 DAYS OF YOUR PROCEDUE WITH YOUR RESULTS. Do not assume everything is normal if you have not heard from DR. Dhilan Brauer IN ONE WEEK, CALL HER OFFICE AT [phone].  SEEK IMMEDIATE MEDICAL ATTENTION AND CALL THE OFFICE: [phone] IF:  You have more than a spotting of blood in your stool.   Your belly is swollen (abdominal distention).   You are nauseated or vomiting.   You have a temperature over 101F.   You have abdominal pain or discomfort that is severe or gets worse throughout the day.    High-Fiber Diet A high-fiber diet changes your normal diet to include more whole grains, legumes, fruits, and vegetables. Changes in the diet involve replacing refined carbohydrates with unrefined foods. The calorie level of the diet is essentially unchanged. The Dietary Reference Intake (recommended amount) for adult males is 38 grams per day. For adult females, it is 25 grams per day. Pregnant and lactating women should consume 28 grams of fiber per day. Fiber is the intact part of a plant that is not broken down during digestion. Functional fiber is fiber that has been isolated from the plant to provide a beneficial effect in the body.  PURPOSE  Increase stool bulk.   Ease and regulate bowel movements.   Lower cholesterol.   REDUCE RISK OF COLON CANCER  INDICATIONS THAT YOU NEED MORE FIBER  Constipation and hemorrhoids.   Uncomplicated diverticulosis (intestine condition) and irritable bowel syndrome.   Weight management.   As a protective measure against hardening of the arteries (atherosclerosis), diabetes, and cancer.   GUIDELINES FOR INCREASING FIBER IN THE DIET  Start adding fiber to the diet slowly. A gradual increase of about 5 more grams (2 slices of whole-wheat bread, 2 servings of most fruits or vegetables, or 1 bowl of high-fiber cereal) per day is best. Too rapid an  increase in fiber may result in constipation, flatulence, and bloating.   Drink enough water and fluids to keep your urine clear or pale yellow. Water, juice, or caffeine-free drinks are recommended. Not drinking enough fluid may cause constipation.   Eat a variety of high-fiber foods rather than one type of fiber.   Try to increase your intake of fiber through using high-fiber foods rather than fiber pills or supplements that contain small amounts of fiber.   The goal is to change the types of food eaten. Do not supplement your present diet with high-fiber foods, but replace foods in your present diet.   INCLUDE A VARIETY OF FIBER SOURCES  Replace refined and processed grains with whole grains, canned fruits with fresh fruits, and incorporate other fiber sources. White rice, white breads, and most bakery goods contain little or no fiber.   Brown whole-grain rice, buckwheat oats, and many fruits and vegetables are all good sources of fiber. These include: broccoli, Brussels sprouts, cabbage, cauliflower, beets, sweet potatoes, white potatoes (skin on), carrots, tomatoes, eggplant, squash, berries, fresh fruits, and dried fruits.   Cereals appear to be the richest source of fiber. Cereal fiber is found in whole grains and bran. Bran is the fiber-rich outer coat of cereal grain, which is largely removed in refining. In whole-grain cereals, the bran remains. In breakfast cereals, the largest amount of fiber is found in those with "bran" in their names. The fiber content is sometimes indicated on the label.   You may need to include additional fruits and vegetables each day.   In baking, for 1 cup white flour, you may use the following substitutions:   1 cup whole-wheat flour minus 2 tablespoons.   1/2 cup white flour plus 1/2 cup whole-wheat flour.   Diverticulosis Diverticulosis is a common condition that develops when small pouches (diverticula) form in the wall of the colon. The risk of  diverticulosis increases with age. It happens more often in people who eat a low-fiber diet. Most individuals with diverticulosis have no symptoms. Those individuals with symptoms usually experience belly (abdominal) pain, constipation, or loose stools (diarrhea).  HOME CARE INSTRUCTIONS  Increase the amount of fiber in your diet as directed by your caregiver or dietician. This may reduce symptoms of diverticulosis.   Drink at least 6 to 8 glasses of water each day to prevent constipation.   Try not to strain when you have a bowel movement.   THERE IS NO NEED TO Avoid nuts and seeds to prevent complications.   FOODS HAVING HIGH FIBER CONTENT INCLUDE:  Fruits. Apple, peach, pear, tangerine, raisins, prunes.   Vegetables. Brussels sprouts, asparagus, broccoli, cabbage, carrot, cauliflower, romaine lettuce, spinach, summer squash, tomato, winter squash, zucchini.   Starchy Vegetables. Baked beans, kidney beans, lima beans, split peas, lentils, potatoes (with skin).   Grains. Whole wheat bread, brown rice, bran flake cereal, plain oatmeal, white rice, shredded wheat, bran muffins.

## 2018-10-09 NOTE — H&P (Signed)
Primary Care Physician:  Merlyn Albert, MD Primary Gastroenterologist:  Dr. Darrick Penna  Pre-Procedure History & Physical: HPI:  James Barr is a 53 y.o. male here for COLON CANCER SCREENING.  Past Medical History:  Diagnosis Date  . GERD (gastroesophageal reflux disease)   . Nephrolithiasis     Past Surgical History:  Procedure Laterality Date  . pyloric stenosis     as infant    Prior to Admission medications   Medication Sig Start Date End Date Taking? Authorizing Provider  Multiple Vitamin (MULTIVITAMIN) tablet Take 1 tablet by mouth daily.   Yes [provider]  omeprazole (PRILOSEC) 20 MG capsule TAKE ONE CAPSULE BY MOUTH TWICE DAILY FOR  ACID  REFLUX Patient taking differently: Take 20 mg by mouth 2 (two) times daily. TAKE ONE CAPSULE BY MOUTH TWICE DAILY FOR  ACID  REFLUX 01/02/18  Yes Merlyn Albert, MD  polyethylene glycol-electrolytes (TRILYTE) 420 g solution Take 4,000 mLs by mouth as directed. 07/28/18  Yes Anice Paganini, NP    Allergies as of 07/28/2018  . (No Known Allergies)    Family History  Problem Relation Age of Onset  . Hypertension Mother   . Hypertension Father   . Diabetes Brother   . Colon cancer Neg Hx   . Colon polyps Neg Hx     Social History   Socioeconomic History  . Marital status: Married    Spouse name: Not on file  . Number of children: Not on file  . Years of education: Not on file  . Highest education level: Not on file  Occupational History  . Not on file  Social Needs  . Financial resource strain: Not on file  . Food insecurity:    Worry: Not on file    Inability: Not on file  . Transportation needs:    Medical: Not on file    Non-medical: Not on file  Tobacco Use  . Smoking status: Light Tobacco Smoker    Types: Cigars  . Smokeless tobacco: Never Used  Substance and Sexual Activity  . Alcohol use: Yes    Alcohol/week: 0.0 standard drinks    Comment: glass of wine on occasion  . Drug use: No   . Sexual activity: Not on file  Lifestyle  . Physical activity:    Days per week: Not on file    Minutes per session: Not on file  . Stress: Not on file  Relationships  . Social connections:    Talks on phone: Not on file    Gets together: Not on file    Attends religious service: Not on file    Active member of club or organization: Not on file    Attends meetings of clubs or organizations: Not on file    Relationship status: Not on file  . Intimate partner violence:    Fear of current or ex partner: Not on file    Emotionally abused: Not on file    Physically abused: Not on file    Forced sexual activity: Not on file  Other Topics Concern  . 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    Review of Systems: See HPI, otherwise negative ROS   Physical Exam: BP 135/86   Pulse 73   Temp 98.5 F (36.9 C) (Oral)   Resp 12   Ht 5\' 6"  (1.676 m)   Wt 90.7 kg   SpO2 99%   BMI 32.28  kg/m  General:   Alert,  pleasant and cooperative in NAD Head:  Normocephalic and atraumatic. Neck:  Supple; Lungs:  Clear throughout to auscultation.    Heart:  Regular rate and rhythm. Abdomen:  Soft, nontender and nondistended. Normal bowel sounds, without guarding, and without rebound.   Neurologic:  Alert and  oriented x4;  grossly normal neurologically.  Impression/Plan:    SCREENING  Plan:  1. TCS TODAY DISCUSSED PROCEDURE, BENEFITS, & RISKS: < 1% chance of medication reaction, bleeding, perforation, or rupture of spleen/liver.

## 2018-10-09 NOTE — Op Note (Signed)
[facility] Patient Name: James Barr Procedure Date: 10/09/2018 10:26 AM MRN: [id number] Date of Birth: 1965/12/04 Attending MD: Jonette Eva MD, MD CSN: [id number] Age: 53 Admit Type: Outpatient Procedure:                Colonoscopy, SCREENING Indications:              Screening for colorectal malignant neoplasm Providers:                Jonette Eva MD, MD, Jannett Celestine, RN, Tammy Vaught,                            RN, Sterling Big, RN Referring MD:             Simone Curia, MD Medicines:                Meperidine 100 mg IV, Midazolam 8 mg IV Complications:            No immediate complications. Estimated Blood Loss:     Estimated blood loss: none. Procedure:                Pre-Anesthesia Assessment:                           - Prior to the procedure, a History and Physical                            was performed, and patient medications and                            allergies were reviewed. The patient's tolerance of                            previous anesthesia was also reviewed. The risks                            and benefits of the procedure and the sedation                            options and risks were discussed with the patient.                            All questions were answered, and informed consent                            was obtained. Prior Anticoagulants: The patient has                            taken no previous anticoagulant or antiplatelet                            agents. ASA Grade Assessment: I - A normal, healthy                            patient. After reviewing the risks and benefits,  the patient was deemed in satisfactory condition to                            undergo the procedure. After obtaining informed                            consent, the colonoscope was passed under direct                            vision. Throughout the procedure, the patient's                            blood pressure,  pulse, and oxygen saturations were                            monitored continuously. The [id number] ([id number]                            scope was introduced through the anus and advanced                            to the the cecum, identified by appendiceal orifice                            and ileocecal valve. The colonoscopy was somewhat                            difficult due to a tortuous colon. Successful                            completion of the procedure was aided by                            straightening and shortening the scope to obtain                            bowel loop reduction and COLOWRAP. The patient                            tolerated the procedure well. The quality of the                            bowel preparation was excellent. The ileocecal                            valve, appendiceal orifice, and rectum were                            photographed. Scope In: 11:08:23 AM Scope Out: 11:23:57 AM Scope Withdrawal Time: 0 hours 13 minutes 17 seconds  Total Procedure Duration: 0 hours 15 minutes 34 seconds  Findings:      Multiple small and large-mouthed diverticula were found in the hepatic       flexure and ascending colon.  The recto-sigmoid colon and sigmoid colon were mildly redundant.      Internal hemorrhoids were found. The hemorrhoids were small. Impression:               - MILD Diverticulosis IN THE RIGHT colon.                           - Redundant colon.                           - Internal hemorrhoids. Moderate Sedation:      Moderate (conscious) sedation was administered by the endoscopy nurse       and supervised by the endoscopist. The following parameters were       monitored: oxygen saturation, heart rate, blood pressure, and response       to care. Total physician intraservice time was 32 minutes. Recommendation:           - Patient has a contact number available for                            emergencies. The signs and symptoms of  potential                            delayed complications were discussed with the                            patient. Return to normal activities tomorrow.                            Written discharge instructions were provided to the                            patient.                           - High fiber diet.                           - Continue present medications.                           - Repeat colonoscopy in 10 years for surveillance. Procedure Code(s):        --- Professional ---                           [id number], Colonoscopy, flexible; diagnostic, including                            collection of specimen(s) by brushing or washing,                            when performed (separate procedure)                           99153, Moderate sedation; each additional 15  minutes intraservice time                           G0500, Moderate sedation services provided by the                            same physician or other qualified health care                            professional performing a gastrointestinal                            endoscopic service that sedation supports,                            requiring the presence of an independent trained                            observer to assist in the monitoring of the                            patient's level of consciousness and physiological                            status; initial 15 minutes of intra-service time;                            patient age 69 years or older (additional time may                            be reported with 72094, as appropriate) Diagnosis Code(s):        --- Professional ---                           Z12.11, Encounter for screening for malignant                            neoplasm of colon                           K64.8, Other hemorrhoids                           K57.30, Diverticulosis of large intestine without                            perforation or abscess  without bleeding                           Q43.8, Other specified congenital malformations of                            intestine CPT copyright 2018 American Medical Association. All rights reserved. The codes documented in this report are preliminary and upon coder review may  be revised to meet current compliance requirements. Jonette Eva, MD Duncan Dull  Samora Jernberg MD, MD 10/09/2018 11:45:08 AM This report has been signed electronically. Number of Addenda: 0

## 2018-10-11 ENCOUNTER — Encounter (HOSPITAL_COMMUNITY): Payer: Self-pay | Admitting: Gastroenterology

## 2018-12-05 ENCOUNTER — Telehealth: Payer: Self-pay | Admitting: Family Medicine

## 2018-12-05 MED ORDER — OMEPRAZOLE 20 MG PO CPDR: DELAYED_RELEASE_CAPSULE | ORAL | 2 refills | Status: DC

## 2018-12-05 NOTE — Telephone Encounter (Signed)
Prescription sent electronically to pharmacy. Left message to return call to notify patient. 

## 2018-12-05 NOTE — Telephone Encounter (Signed)
REQUESTING REFILL FOR:  omeprazole (PRILOSEC) 20 MG capsule   Pharmacy:  [id number] - [place], [place] - [address]

## 2018-12-05 NOTE — Telephone Encounter (Signed)
Patient aware.

## 2019-03-14 ENCOUNTER — Other Ambulatory Visit: Payer: BLUE CROSS/BLUE SHIELD

## 2019-03-14 ENCOUNTER — Other Ambulatory Visit: Payer: Self-pay

## 2019-03-14 DIAGNOSIS — Z20822 Contact with and (suspected) exposure to covid-19: Secondary | ICD-10-CM

## 2019-03-21 LAB — NOVEL CORONAVIRUS, NAA: SARS-CoV-2, NAA: NOT DETECTED

## 2019-05-11 ENCOUNTER — Other Ambulatory Visit: Payer: Self-pay | Admitting: *Deleted

## 2019-05-11 DIAGNOSIS — Z20822 Contact with and (suspected) exposure to covid-19: Secondary | ICD-10-CM

## 2019-05-12 LAB — NOVEL CORONAVIRUS, NAA: SARS-CoV-2, NAA: NOT DETECTED

## 2019-05-14 ENCOUNTER — Encounter: Payer: Self-pay | Admitting: Family Medicine

## 2019-05-14 ENCOUNTER — Telehealth: Payer: Self-pay | Admitting: Family Medicine

## 2019-05-14 ENCOUNTER — Ambulatory Visit (INDEPENDENT_AMBULATORY_CARE_PROVIDER_SITE_OTHER): Payer: BC Managed Care – PPO | Admitting: Family Medicine

## 2019-05-14 ENCOUNTER — Other Ambulatory Visit: Payer: Self-pay

## 2019-05-14 DIAGNOSIS — K529 Noninfective gastroenteritis and colitis, unspecified: Secondary | ICD-10-CM | POA: Diagnosis not present

## 2019-05-14 NOTE — Telephone Encounter (Signed)
Pt contacted and verbalized understanding. Pt transferred up front to set up appt with provider.

## 2019-05-14 NOTE — Telephone Encounter (Signed)
Tricky recommend virt o v tod

## 2019-05-14 NOTE — Telephone Encounter (Signed)
Pt had diarrhea and fever of 102 on Friday and was sent for COVID testing. Stomach is still queasy and stools are a bit runny. Pt did eat crackers this morning.  Pt states he has no fever now.  Pt states he stayed in bed all weekend just to quarantine himself. Pt states his workplace is needing a note; pt works 2nd shift. Please advise. Thank you

## 2019-05-14 NOTE — Telephone Encounter (Signed)
Pt called to say he missed work Friday due to a stomach virus, his employer asked that he get tested for Covid, pt's test results were negative  His employer is requiring a note from the doctor before pt can return to work  Please advise & call pt

## 2019-05-14 NOTE — Progress Notes (Signed)
   Subjective:  Audio only  Patient ID: James Barr, male    DOB: 1966/02/15, 53 y.o.   MRN: [id number]  Diarrhea  This is a new problem. Episode onset: 3 days. The problem occurs 2 to 4 times per day. The stool consistency is described as watery. The patient states that diarrhea awakens (when it first started it would wake from sleep) him from sleep. Associated symptoms include a fever. Treatments tried: crackers and ginger ale, tylenol.   Virtual Visit via Video Note  I connected with Ramiz Turpin Marley on 05/14/19 at  4:10 PM EDT by a video enabled telemedicine application and verified that I am speaking with the correct person using two identifiers.  Location: Patient: home Provider: office   I discussed the limitations of evaluation and management by telemedicine and the availability of in person appointments. The patient expressed understanding and agreed to proceed.  History of Present Illness:    Observations/Objective:   Assessment and Plan:   Follow Up Instructions:    I discussed the assessment and treatment plan with the patient. The patient was provided an opportunity to ask questions and all were answered. The patient agreed with the plan and demonstrated an understanding of the instructions.   The patient was advised to call back or seek an in-person evaluation if the symptoms worsen or if the condition fails to improve as anticipated.  I provided 18 minutes of non-face-to-face time during this encounter.  Patient states now feeling fine.  Friday through Sunday had diarrhea.  Fever times.  No cough no sore throat no congestion no shortness of breath.  He is eager to return to work.  His workplace is apparently eager for him to return to work    Review of Systems  Constitutional: Positive for fever.  Gastrointestinal: Positive for diarrhea.       Objective:   Physical Exam  Virtual      Assessment & Plan:  Impression gastroenteritis-like  illness with negative COVID-19 status.  Per CDC guidelines patient may return to work.  Discussed with patient.  Also cautioned him about potential for false negative test results and to contact us if he develops further symptomatology or recurrence symptom care discussed

## 2019-05-15 ENCOUNTER — Encounter: Payer: Self-pay | Admitting: Family Medicine

## 2019-08-01 ENCOUNTER — Other Ambulatory Visit: Payer: Self-pay

## 2019-08-01 DIAGNOSIS — Z20822 Contact with and (suspected) exposure to covid-19: Secondary | ICD-10-CM

## 2019-08-03 LAB — NOVEL CORONAVIRUS, NAA: SARS-CoV-2, NAA: NOT DETECTED

## 2019-08-06 ENCOUNTER — Other Ambulatory Visit: Payer: Self-pay | Admitting: Family Medicine

## 2019-10-09 ENCOUNTER — Other Ambulatory Visit: Payer: Self-pay | Admitting: Family Medicine

## 2019-10-09 NOTE — Telephone Encounter (Signed)
10/819 wellness visit

## 2019-10-17 ENCOUNTER — Encounter: Payer: Self-pay | Admitting: Family Medicine

## 2019-11-24 ENCOUNTER — Ambulatory Visit: Payer: Self-pay | Attending: Internal Medicine

## 2019-11-24 DIAGNOSIS — Z23 Encounter for immunization: Secondary | ICD-10-CM

## 2019-11-24 NOTE — Progress Notes (Signed)
   Covid-19 Vaccination Clinic  Name:  James Barr    MRN: [id number] DOB: 06/13/1966  11/24/2019  [name] was observed post Covid-19 immunization for 15 minutes without incident. He was provided with Vaccine Information Sheet and instruction to access the V-Safe system.   [name] was instructed to call 911 with any severe reactions post vaccine: Marland Kitchen Difficulty breathing  . Swelling of face and throat  . A fast heartbeat  . A bad rash all over body  . Dizziness and weakness   Immunizations Administered    Name Date Dose VIS Date Route   Moderna COVID-19 Vaccine 11/24/2019 11:54 AM 0.5 mL 08/14/2019 Intramuscular   Manufacturer: Moderna   Lot: [id number]   NDC: 67255-001-64

## 2019-12-11 ENCOUNTER — Emergency Department (HOSPITAL_COMMUNITY): Payer: 59

## 2019-12-11 ENCOUNTER — Encounter (HOSPITAL_COMMUNITY): Payer: Self-pay | Admitting: *Deleted

## 2019-12-11 ENCOUNTER — Emergency Department (HOSPITAL_COMMUNITY)
Admission: EM | Admit: 2019-12-11 | Discharge: 2019-12-12 | Disposition: A | Discharge status: Home / Self Care | Payer: 59 | Attending: Emergency Medicine | Admitting: Emergency Medicine

## 2019-12-11 ENCOUNTER — Other Ambulatory Visit: Payer: Self-pay

## 2019-12-11 DIAGNOSIS — R944 Abnormal results of kidney function studies: Secondary | ICD-10-CM | POA: Diagnosis not present

## 2019-12-11 DIAGNOSIS — R7989 Other specified abnormal findings of blood chemistry: Secondary | ICD-10-CM

## 2019-12-11 DIAGNOSIS — F17210 Nicotine dependence, cigarettes, uncomplicated: Secondary | ICD-10-CM | POA: Diagnosis not present

## 2019-12-11 DIAGNOSIS — R1031 Right lower quadrant pain: Secondary | ICD-10-CM | POA: Diagnosis present

## 2019-12-11 DIAGNOSIS — N200 Calculus of kidney: Secondary | ICD-10-CM | POA: Diagnosis not present

## 2019-12-11 DIAGNOSIS — R112 Nausea with vomiting, unspecified: Secondary | ICD-10-CM | POA: Insufficient documentation

## 2019-12-11 LAB — COMPREHENSIVE METABOLIC PANEL
ALT: 27 U/L (ref 0–44)
AST: 20 U/L (ref 15–41)
Albumin: 4.1 g/dL (ref 3.5–5.0)
Alkaline Phosphatase: 66 U/L (ref 38–126)
Anion gap: 7 (ref 5–15)
BUN: 14 mg/dL (ref 6–20)
CO2: 25 mmol/L (ref 22–32)
Calcium: 8.8 mg/dL — ABNORMAL LOW (ref 8.9–10.3)
Chloride: 107 mmol/L (ref 98–111)
Creatinine, Ser: 1.26 mg/dL — ABNORMAL HIGH (ref 0.61–1.24)
GFR calc Af Amer: >60 mL/min (ref >60)
GFR calc non Af Amer: >60 mL/min (ref >60)
Glucose, Bld: 104 mg/dL — ABNORMAL HIGH (ref 70–99)
Potassium: 3.9 mmol/L (ref 3.5–5.1)
Sodium: 139 mmol/L (ref 135–145)
Total Bilirubin: 0.4 mg/dL (ref 0.3–1.2)
Total Protein: 7.2 g/dL (ref 6.5–8.1)

## 2019-12-11 LAB — CBC
HCT: 41.4 % (ref 39.0–52.0)
Hemoglobin: 13.8 g/dL (ref 13.0–17.0)
MCH: 32.7 pg (ref 26.0–34.0)
MCHC: 33.3 g/dL (ref 30.0–36.0)
MCV: 98.1 fL (ref 80.0–100.0)
Platelets: 220 10*3/uL (ref 150–400)
RBC: 4.22 MIL/uL (ref 4.22–5.81)
RDW: 12.7 % (ref 11.5–15.5)
WBC: 6.2 10*3/uL (ref 4.0–10.5)
nRBC: 0 % (ref 0.0–0.2)

## 2019-12-11 MED ORDER — ONDANSETRON HCL 4 MG/2ML IJ SOLN
4.0000 mg | Freq: Once | INTRAMUSCULAR | Status: AC
  Administered 2019-12-11: 20:00:00 4 mg via INTRAVENOUS
  Filled 2019-12-11: qty 2

## 2019-12-11 MED ORDER — MORPHINE SULFATE (PF) 4 MG/ML IV SOLN
4.0000 mg | Freq: Once | INTRAVENOUS | Status: AC
  Administered 2019-12-11: 4 mg via INTRAVENOUS
  Filled 2019-12-11: qty 1

## 2019-12-11 MED ORDER — SODIUM CHLORIDE 0.9 % IV BOLUS
1000.0000 mL | Freq: Once | INTRAVENOUS | Status: AC
  Administered 2019-12-11: 1000 mL via INTRAVENOUS

## 2019-12-11 MED ORDER — KETOROLAC TROMETHAMINE 30 MG/ML IJ SOLN
15.0000 mg | Freq: Once | INTRAMUSCULAR | Status: AC
  Administered 2019-12-11: 15 mg via INTRAVENOUS
  Filled 2019-12-11: qty 1

## 2019-12-11 NOTE — ED Provider Notes (Signed)
[facility] EMERGENCY DEPARTMENT Provider Note   CSN: [id number] Arrival date & time: 12/11/19  1937     History Chief Complaint  Patient presents with  . Flank Pain    James Barr is a 54 y.o. male with a history of GERD, pre diabetes, hyperlipidemia, & prior nephrolithiasis who presents to the ED with complaints of rightwith complaints of right flank pain that began approximately 1 hour PTA. Patient states pain is in the R flank, radiates into the RLQ of the abdomen, waxing/waning in severity, currently a 10/10 in severity, no alleviating/aggravating factors. Reports associated nausea & vomiting. Denies fever, chills, diarrhea, dysuria, or hematuria. He thinks this may be like his prior kidney stones but it has been 15 + years since last stone and he is not entirely sure, has not required surgical intervention for prior kidney stone.   HPI     Past Medical History:  Diagnosis Date  . GERD (gastroesophageal reflux disease)   . Nephrolithiasis     Patient Active Problem List   Diagnosis Date Noted  . Special screening for malignant neoplasms, colon   . Prediabetes 06/20/2018  . Hyperlipidemia LDL goal <130 06/20/2018  . Precordial pain 07/30/2014  . Back pain 05/01/2013    Past Surgical History:  Procedure Laterality Date  . COLONOSCOPY N/A 10/09/2018   Procedure: COLONOSCOPY;  Surgeon: Danie Binder, MD;  Location: [facility];  Service: Endoscopy;  Laterality: N/A;  10:30  . pyloric stenosis     as infant       Family History  Problem Relation Age of Onset  . Hypertension Mother   . Hypertension Father   . Diabetes Brother   . Colon cancer Neg Hx   . Colon polyps Neg Hx     Social History   Tobacco Use  . Smoking status: Light Tobacco Smoker    Types: Cigars  . Smokeless tobacco: Never Used  Substance Use Topics  . Alcohol use: Yes    Alcohol/week: 0.0 standard drinks    Comment: glass of wine on occasion  . Drug use: No    Home  Medications Prior to Admission medications   Medication Sig Start Date End Date Taking? Authorizing Provider  Multiple Vitamin (MULTIVITAMIN) tablet Take 1 tablet by mouth daily.    [provider]  omeprazole (PRILOSEC) 20 MG capsule TAKE 1 CAPSULE BY MOUTH TWICE DAILY FOR ACID REFLUX 10/09/19   Mikey Kirschner, MD    Allergies    Patient has no known allergies.  Review of Systems   Review of Systems  Constitutional: Negative for chills and fever.  Respiratory: Negative for shortness of breath.   Cardiovascular: Negative for chest pain.  Gastrointestinal: Positive for abdominal pain, nausea and vomiting. Negative for blood in stool, constipation and diarrhea.  Genitourinary: Positive for flank pain. Negative for discharge, dysuria and hematuria.  Neurological: Negative for syncope.  All other systems reviewed and are negative.   Physical Exam Updated Vital Signs BP (!) 154/93   Pulse 78   Temp 98.9 F (37.2 C) (Oral)   Resp 20   Ht 5\' 6"  (1.676 m)   Wt 95.3 kg   SpO2 100%   BMI 33.89 kg/m   Physical Exam Vitals and nursing note reviewed.  Constitutional:      General: He is in acute distress (mild, appears uncomfortable).     Appearance: He is well-developed. He is not toxic-appearing.  HENT:     Head: Normocephalic and atraumatic.  Eyes:     General:        Right eye: No discharge.        Left eye: No discharge.     Conjunctiva/sclera: Conjunctivae normal.  Cardiovascular:     Rate and Rhythm: Normal rate and regular rhythm.  Pulmonary:     Effort: Pulmonary effort is normal. No respiratory distress.     Breath sounds: Normal breath sounds. No wheezing, rhonchi or rales.  Abdominal:     General: There is no distension.     Palpations: Abdomen is soft.     Tenderness: There is abdominal tenderness (RLQ). There is right CVA tenderness. There is no guarding or rebound.  Musculoskeletal:     Cervical back: Neck supple.  Skin:    General: Skin is warm  and dry.     Findings: No rash.  Neurological:     Mental Status: He is alert.     Comments: Clear speech.   Psychiatric:        Behavior: Behavior normal.    ED Results / Procedures / Treatments   Labs (all labs ordered are listed, but only abnormal results are displayed) Labs Reviewed  COMPREHENSIVE METABOLIC PANEL - Abnormal; Notable for the following components:      Result Value   Glucose, Bld 104 (*)    Creatinine, Ser 1.26 (*)    Calcium 8.8 (*)    All other components within normal limits  URINE CULTURE  CBC  URINALYSIS, ROUTINE W REFLEX MICROSCOPIC    EKG None  Radiology CT Renal Stone Study  Result Date: 12/11/2019 CLINICAL DATA:  Right-sided flank pain EXAM: CT ABDOMEN AND PELVIS WITHOUT CONTRAST TECHNIQUE: Multidetector CT imaging of the abdomen and pelvis was performed following the standard protocol without IV contrast. COMPARISON:  11/01/2001 FINDINGS: Lower chest: No acute abnormality. Hepatobiliary: No focal liver abnormality is seen. No gallstones, gallbladder wall thickening, or biliary dilatation. Pancreas: Unremarkable. No pancreatic ductal dilatation or surrounding inflammatory changes. Spleen: Normal in size without focal abnormality. Adrenals/Urinary Tract: Adrenal glands are within normal limits. Left kidney is within normal limits without renal calculi or obstructive changes. The bladder is decompressed. Right kidney demonstrates generalized edema with decreased attenuation and mild hydronephrosis and hydroureter. A tiny nonobstructing 1 mm stone is noted within the lower pole of the right kidney. The ureter is dilated to the level of the distal ureter at which point a small 1 mm stone is noted best seen on images 75 of series 2 and 60 of series 5. Stomach/Bowel: The appendix is well visualized and within normal limits. No obstructive or inflammatory changes of the colon are seen. The stomach is within normal limits. No small bowel abnormality is seen.  Vascular/Lymphatic: Aortic atherosclerosis. No enlarged abdominal or pelvic lymph nodes. Reproductive: Prostate is unremarkable. Other: No abdominal wall hernia or abnormality. No abdominopelvic ascites. Musculoskeletal: No acute or significant osseous findings. IMPRESSION: Tiny 1 mm distal right ureteral stone with mild hydronephrosis. Tiny 1 mm nonobstructing stone in the lower pole of the right kidney. No other focal abnormality is noted. Electronically Signed   By: Alcide Clever M.D.   On: 12/11/2019 21:14    Procedures Procedures (including critical care time)  Medications Ordered in ED Medications  ondansetron [facility]) injection 4 mg (4 mg Intravenous Given 12/11/19 2029)  sodium chloride 0.9 % bolus 1,000 mL (0 mLs Intravenous Stopped 12/11/19 2201)  morphine 4 MG/ML injection 4 mg (4 mg Intravenous Given 12/11/19 2029)  ketorolac (TORADOL) 30 MG/ML  injection 15 mg (15 mg Intravenous Given 12/11/19 2159)    ED Course  I have reviewed the triage vital signs and the nursing notes.  Pertinent labs & imaging results that were available during my care of the patient were reviewed by me and considered in my medical decision making (see chart for details).    MDM Rules/Calculators/A&P                     Patient presents to the ED with complaints of flank/abdominal pain. Patient nontoxic appearing, does appear uncomfortable, vitals without significant abnormalities- BP elevated, doubt HTN emergency. On exam patient is tender to the R CVA and RLQ of the abdomen, no peritoneal signs. DDX: nephrolithiasis, pyelonephritis/UTI, cholecystitis, bowel obstruction/perforation, appendicitis, dissection, feel nephrolithiasis is most likely at this time will evaluate with labs and CT renal study given several years since last stone and patient unsure if this feels the same, analgesics, anti-emetics, and fluids ordered.   No leukocytosis, anemia, or significant electrolyte derangements.  CT renal study with  Tiny 1 mm distal right ureteral stone with mild hydronephrosis. Tiny 1 mm nonobstructing stone in the lower pole of the right kidney. No other focal abnormality is noted. --> Likely etiology of his sxs. Mild elevation in creatinine.   21:40: RE-EVAL: Patient reports continued pain, minimal creatinine elevation will give small dose of toradol, discussed w/ Dr. Juleen China- in agreement.   22:50: Patient care transitioned to Tammy Triplett PA-C at change of shift pending UA and disposition.   Final Clinical Impression(s) / ED Diagnoses Final diagnoses:  Kidney stone  Elevated serum creatinine    Rx / DC Orders ED Discharge Orders    None       Desmond Lope 12/11/19 2251    Raeford Razor, MD 12/12/19 1827

## 2019-12-11 NOTE — ED Triage Notes (Signed)
Pt with right flank pain for an hour with N/V, hx of kidney stones in the past.

## 2019-12-11 NOTE — ED Provider Notes (Signed)
   Patient signed out to me by Harvie Heck, PA-C pending completion of work-up.  Patient is a 54 year old male presented with flank pain was found to have a 1 mm right ureteral stone with mild hydronephrosis.  Urinalysis pending.  On my exam, patient sitting on the stretcher.  Pain controlled.  Urinalysis is within normal limits   Patient appears appropriate for discharge home.  He agrees to close follow-up with urology and prescriptions written for pain medication, Flomax, and antiemetic.    Labs Reviewed  COMPREHENSIVE METABOLIC PANEL - Abnormal; Notable for the following components:      Result Value   Glucose, Bld 104 (*)    Creatinine, Ser 1.26 (*)    Calcium 8.8 (*)    All other components within normal limits  URINE CULTURE  CBC  URINALYSIS, ROUTINE W REFLEX MICROSCOPIC     CT Renal Stone Study  Result Date: 12/11/2019 CLINICAL DATA:  Right-sided flank pain EXAM: CT ABDOMEN AND PELVIS WITHOUT CONTRAST TECHNIQUE: Multidetector CT imaging of the abdomen and pelvis was performed following the standard protocol without IV contrast. COMPARISON:  11/01/2001 FINDINGS: Lower chest: No acute abnormality. Hepatobiliary: No focal liver abnormality is seen. No gallstones, gallbladder wall thickening, or biliary dilatation. Pancreas: Unremarkable. No pancreatic ductal dilatation or surrounding inflammatory changes. Spleen: Normal in size without focal abnormality. Adrenals/Urinary Tract: Adrenal glands are within normal limits. Left kidney is within normal limits without renal calculi or obstructive changes. The bladder is decompressed. Right kidney demonstrates generalized edema with decreased attenuation and mild hydronephrosis and hydroureter. A tiny nonobstructing 1 mm stone is noted within the lower pole of the right kidney. The ureter is dilated to the level of the distal ureter at which point a small 1 mm stone is noted best seen on images 75 of series 2 and 60 of series 5.  Stomach/Bowel: The appendix is well visualized and within normal limits. No obstructive or inflammatory changes of the colon are seen. The stomach is within normal limits. No small bowel abnormality is seen. Vascular/Lymphatic: Aortic atherosclerosis. No enlarged abdominal or pelvic lymph nodes. Reproductive: Prostate is unremarkable. Other: No abdominal wall hernia or abnormality. No abdominopelvic ascites. Musculoskeletal: No acute or significant osseous findings. IMPRESSION: Tiny 1 mm distal right ureteral stone with mild hydronephrosis. Tiny 1 mm nonobstructing stone in the lower pole of the right kidney. No other focal abnormality is noted. Electronically Signed   By: Alcide Clever M.D.   On: 12/11/2019 21:14      Pauline Aus, PA-C 12/12/19 0007    Raeford Razor, MD 12/12/19 Silva Bandy

## 2019-12-11 NOTE — Discharge Instructions (Addendum)
You were seen in the emergency department and found to have a kidney stone.  We are sending you home with multiple medications to assist with passing the stone:   -Flomax-this is a medication to help pass the stone, it allows urine to exit the body more freely.  Please take this once daily with a meal.  -Percocet-this is a narcotic/controlled substance medication that has potential addicting qualities.  We recommend that you take 1-2 tablets every 6 hours as needed for severe pain.  Do not drive or operate heavy machinery when taking this medicine as it can be sedating. Do not drink alcohol or take other sedating medications when taking this medicine for safety reasons.  Keep this out of reach of small children.  Please be aware this medicine has Tylenol in it (325 mg/tab) do not exceed the maximum dose of Tylenol in a day per over the counter recommendations should you decide to supplement with Tylenol over the counter.   -Zofran-this is an antinausea medication, you may take this every 8 hours as needed for nausea and vomiting, please allow the tablet to dissolve underneath of your tongue.   We have prescribed you new medication(s) today. Discuss the medications prescribed today with your pharmacist as they can have adverse effects and interactions with your other medicines including over the counter and prescribed medications. Seek medical evaluation if you start to experience new or abnormal symptoms after taking one of these medicines, seek care immediately if you start to experience difficulty breathing, feeling of your throat closing, facial swelling, or rash as these could be indications of a more serious allergic reaction  Your creatinine, a measure of kidney function- was mildly elevated today- have this rechecked at your urology appointment or by your primary care provider. Do not use NSAIDs (ibuprofen, advil, aleve, goodie powder etc) as this can further irritate your kidneys.   Please  follow-up with the urology group provided in your discharge instructions within 3 to 5 days.  Return to the ER for new or worsening symptoms including but not limited to worsening pain not controlled by these medicines, inability to keep fluids down, fever, or any other concerns that you may have.

## 2019-12-12 LAB — URINALYSIS, ROUTINE W REFLEX MICROSCOPIC
Bilirubin Urine: NEGATIVE
Glucose, UA: NEGATIVE mg/dL
Hgb urine dipstick: NEGATIVE
Ketones, ur: NEGATIVE mg/dL
Leukocytes,Ua: NEGATIVE
Nitrite: NEGATIVE
Protein, ur: NEGATIVE mg/dL
Specific Gravity, Urine: 1.017 (ref 1.005–1.030)
pH: 6 (ref 5.0–8.0)

## 2019-12-12 MED ORDER — ONDANSETRON HCL 4 MG PO TABS: 4.0000 mg | ORAL_TABLET | Freq: Four times a day (QID) | ORAL | 0 refills | Status: DC

## 2019-12-12 MED ORDER — OXYCODONE-ACETAMINOPHEN 5-325 MG PO TABS: 1.0000 | ORAL_TABLET | ORAL | 0 refills | Status: DC | PRN

## 2019-12-12 MED ORDER — TAMSULOSIN HCL 0.4 MG PO CAPS: 0.4000 mg | ORAL_CAPSULE | Freq: Every day | ORAL | 0 refills | Status: DC

## 2019-12-13 LAB — URINE CULTURE: Culture: NO GROWTH

## 2019-12-13 MED FILL — Oxycodone w/ Acetaminophen Tab 5-325 MG: ORAL | Qty: 6 | Status: AC

## 2019-12-19 ENCOUNTER — Ambulatory Visit: Payer: 59 | Admitting: Family Medicine

## 2019-12-19 ENCOUNTER — Other Ambulatory Visit: Payer: Self-pay | Admitting: Family Medicine

## 2019-12-19 ENCOUNTER — Ambulatory Visit (HOSPITAL_COMMUNITY)
Admission: RE | Admit: 2019-12-19 | Discharge: 2019-12-19 | Disposition: A | Discharge status: Home / Self Care | Payer: 59 | Source: Ambulatory Visit | Attending: Family Medicine | Admitting: Family Medicine

## 2019-12-19 ENCOUNTER — Other Ambulatory Visit: Payer: Self-pay

## 2019-12-19 DIAGNOSIS — M25511 Pain in right shoulder: Secondary | ICD-10-CM

## 2019-12-19 DIAGNOSIS — Z13220 Encounter for screening for lipoid disorders: Secondary | ICD-10-CM

## 2019-12-19 DIAGNOSIS — N200 Calculus of kidney: Secondary | ICD-10-CM

## 2019-12-19 DIAGNOSIS — Z125 Encounter for screening for malignant neoplasm of prostate: Secondary | ICD-10-CM | POA: Diagnosis not present

## 2019-12-19 DIAGNOSIS — R7303 Prediabetes: Secondary | ICD-10-CM

## 2019-12-19 DIAGNOSIS — Z79899 Other long term (current) drug therapy: Secondary | ICD-10-CM

## 2019-12-19 DIAGNOSIS — E785 Hyperlipidemia, unspecified: Secondary | ICD-10-CM

## 2019-12-19 MED ORDER — ETODOLAC 400 MG PO TABS: ORAL_TABLET | ORAL | 0 refills | Status: DC

## 2019-12-19 NOTE — Progress Notes (Signed)
   Subjective:    Patient ID: James Barr, male    DOB: 06/28/1966, 54 y.o.   MRN: [id number]  HPI Patient comes in today for a follow up from recent ER visit. Patient has 1 mm right ureteral stone with mild hydronephrosis. Patient called urologist in [place] but has not been contacted with appointment yet.  Patient states he is feeling fine now and has not had to use meds since last week.  Would like to discuss arthritis in his right shoulder and elbow.  No fam hx   This is second stone present   Pt notes should er pain for the past several months  When extending very painfu  Having trouble sleeping on it  Pain with lifting  And elbow painful  With job does some lifting and pulling  Recalls no injury  Over the yrs shoulder has ben fine   Review of Systems No headache, no major weight loss or weight gain, no chest pain no back pain abdominal pain no change in bowel habits complete ROS otherwise negative     Objective:   Physical Exam Alert vitals stable, NAD. Blood pressure good on repeat. HEENT normal. Lungs clear. Heart regular rate and rhythm.  Right shoulder positive impingement sign right lateral epicondyles distinctly tender      Assessment & Plan:  Impression 1 kidney stone.  Discussed has now passed.  We will do a stone analysis.  No need to see urologist at this time  2.  Right shoulder pain with substantial impingement.  Shoulder x-ray trial of anti-inflammatory medicine plus Codman's exercises  3.  Right lateral epicondylitis.  Forearm strap recommended  Appropriate blood work prior to wellness follow-up with wellness next month.

## 2019-12-20 MED ORDER — OMEPRAZOLE 20 MG PO CPDR: DELAYED_RELEASE_CAPSULE | ORAL | 1 refills | Status: DC

## 2019-12-26 ENCOUNTER — Ambulatory Visit: Payer: 59 | Attending: Internal Medicine

## 2019-12-26 DIAGNOSIS — Z23 Encounter for immunization: Secondary | ICD-10-CM

## 2019-12-26 LAB — CALCULI, WITH PHOTOGRAPH (CLINICAL LAB)
Calcium Oxalate Monohydrate: 100 %
Weight Calculi: 4 mg

## 2019-12-26 NOTE — Progress Notes (Signed)
   Covid-19 Vaccination Clinic  Name:  DAGO JUNGWIRTH    MRN: [id number] DOB: October 24, 1965  12/26/2019  Mr. Soots was observed post Covid-19 immunization for 15 minutes without incident. He was provided with Vaccine Information Sheet and instruction to access the V-Safe system.   Mr. Dimattia was instructed to call 911 with any severe reactions post vaccine: Marland Kitchen Difficulty breathing  . Swelling of face and throat  . A fast heartbeat  . A bad rash all over body  . Dizziness and weakness   Immunizations Administered    Name Date Dose VIS Date Route   Moderna COVID-19 Vaccine 12/26/2019 11:28 AM 0.5 mL 08/14/2019 Intramuscular   Manufacturer: Moderna   Lot: [id number]   NDC: 81025-486-28

## 2020-01-22 LAB — BASIC METABOLIC PANEL
BUN/Creatinine Ratio: 17 (ref 9–20)
BUN: 19 mg/dL (ref 6–24)
CO2: 19 mmol/L — ABNORMAL LOW (ref 20–29)
Calcium: 9 mg/dL (ref 8.7–10.2)
Chloride: 107 mmol/L — ABNORMAL HIGH (ref 96–106)
Creatinine, Ser: 1.13 mg/dL (ref 0.76–1.27)
GFR calc Af Amer: 87 mL/min/{1.73_m2} (ref >59)
GFR calc non Af Amer: 75 mL/min/{1.73_m2} (ref >59)
Glucose: 98 mg/dL (ref 65–99)
Potassium: 4.2 mmol/L (ref 3.5–5.2)
Sodium: 142 mmol/L (ref 134–144)

## 2020-01-22 LAB — LIPID PANEL
Chol/HDL Ratio: 4.4 ratio (ref 0.0–5.0)
Cholesterol, Total: 181 mg/dL (ref 100–199)
HDL: 41 mg/dL (ref >39)
LDL Chol Calc (NIH): 121 mg/dL — ABNORMAL HIGH (ref 0–99)
Triglycerides: 104 mg/dL (ref 0–149)
VLDL Cholesterol Cal: 19 mg/dL (ref 5–40)

## 2020-01-22 LAB — PSA: Prostate Specific Ag, Serum: 1.2 ng/mL (ref 0.0–4.0)

## 2020-01-23 ENCOUNTER — Other Ambulatory Visit: Payer: Self-pay

## 2020-01-23 ENCOUNTER — Encounter: Payer: Self-pay | Admitting: Family Medicine

## 2020-01-23 ENCOUNTER — Ambulatory Visit (INDEPENDENT_AMBULATORY_CARE_PROVIDER_SITE_OTHER): Payer: 59 | Admitting: Family Medicine

## 2020-01-23 VITALS — BP 116/80 | Temp 97.5°F | Wt 211.2 lb

## 2020-01-23 DIAGNOSIS — Z0000 Encounter for general adult medical examination without abnormal findings: Secondary | ICD-10-CM | POA: Diagnosis not present

## 2020-01-23 MED ORDER — OMEPRAZOLE 20 MG PO CPDR: DELAYED_RELEASE_CAPSULE | ORAL | 11 refills | Status: DC

## 2020-01-23 MED ORDER — ETODOLAC 400 MG PO TABS: ORAL_TABLET | ORAL | 2 refills | Status: DC

## 2020-01-23 NOTE — Progress Notes (Signed)
Subjective:    Patient ID: James Barr, male    DOB: 1965-09-26, 54 y.o.   MRN: [id number]  HPI  The patient comes in today for a wellness visit.    A review of their health history was completed.  A review of medications was also completed.  Any needed refills; Lodine and omeprazxole  Eating habits: Decent   Falls/  MVA accidents in past few months: none  Regular exercise: very little  Specialist pt sees on regular basis: none  Preventative health issues were discussed.   Additional concerns: none  Results for orders placed or performed in visit on 12/19/19  PSA  Result Value Ref Range   Prostate Specific Ag, Serum 1.2 0.0 - 4.0 ng/mL  Lipid panel  Result Value Ref Range   Cholesterol, Total 181 100 - 199 mg/dL   Triglycerides 644 0 - 149 mg/dL   HDL 41 >03 mg/dL   VLDL Cholesterol Cal 19 5 - 40 mg/dL   LDL Chol Calc (NIH) 474 (H) 0 - 99 mg/dL   Chol/HDL Ratio 4.4 0.0 - 5.0 ratio  Basic metabolic panel  Result Value Ref Range   Glucose 98 65 - 99 mg/dL   BUN 19 6 - 24 mg/dL   Creatinine, Ser 2.59 0.76 - 1.27 mg/dL   GFR calc non Af Amer 75 >59 mL/min/1.73   GFR calc Af Amer 87 >59 mL/min/1.73   BUN/Creatinine Ratio 17 9 - 20   Sodium 142 134 - 144 mmol/L   Potassium 4.2 3.5 - 5.2 mmol/L   Chloride 107 (H) 96 - 106 mmol/L   CO2 19 (L) 20 - 29 mmol/L   Calcium 9.0 8.7 - 10.2 mg/dL  Calculi, with Photograph  Result Value Ref Range   Source Calculi Comment    Color Calculi Brown    Size Calculi 2x2 mm   Weight Calculi 4 mg   Composition Calculi Comment    Calcium Oxalate Monohydrate 100 %   Photo Calculi Comment    Comment Calculi 3 Comment    Please Note: Comment    DISCLAIMER: Comment    Still having persistent lateral elbow pain, pt wears straps     Review of Systems No headache, no major weight loss or weight gain, no chest pain no back pain abdominal pain no change in bowel habits complete ROS otherwise negative     Objective:   Physical Exam Vitals reviewed.  Constitutional:      Appearance: He is well-developed.  HENT:     Head: Normocephalic and atraumatic.     Right Ear: External ear normal.     Left Ear: External ear normal.     Nose: Nose normal.  Eyes:     Pupils: Pupils are equal, round, and reactive to light.  Neck:     Thyroid: No thyromegaly.  Cardiovascular:     Rate and Rhythm: Normal rate and regular rhythm.     Heart sounds: Normal heart sounds. No murmur.  Pulmonary:     Effort: Pulmonary effort is normal. No respiratory distress.     Breath sounds: Normal breath sounds. No wheezing.  Abdominal:     General: Bowel sounds are normal. There is no distension.     Palpations: Abdomen is soft. There is no mass.     Tenderness: There is no abdominal tenderness.  Genitourinary:    Penis: Normal.   Musculoskeletal:        General: Normal range of motion.  Cervical back: Normal range of motion and neck supple.  Lymphadenopathy:     Cervical: No cervical adenopathy.  Skin:    General: Skin is warm and dry.     Findings: No erythema.  Neurological:     Mental Status: He is alert.     Motor: No abnormal muscle tone.  Psychiatric:        Behavior: Behavior normal.        Judgment: Judgment normal.           Assessment & Plan:  Impression 1 wellness exam.  Blood work reviewed.  Diet discussed.  Exercise discussed.  Up-to-date on colonoscopy.  Has had Covid vaccines  2.  Lateral epicondylitis.  Persist.  Will add Lodine as needed.  If persists call back and we will set up with Ortho  Blood work reviewed diet exercise discussed

## 2021-03-23 ENCOUNTER — Telehealth: Payer: 59 | Admitting: Family Medicine

## 2021-03-23 NOTE — Telephone Encounter (Signed)
Last labs 01/21/20 lipid, bmp, psa

## 2021-03-23 NOTE — Telephone Encounter (Signed)
Patient has physical on 8/15 and needing labs

## 2021-03-24 ENCOUNTER — Other Ambulatory Visit: Payer: Self-pay | Admitting: *Deleted

## 2021-03-24 DIAGNOSIS — Z0000 Encounter for general adult medical examination without abnormal findings: Secondary | ICD-10-CM

## 2021-03-24 DIAGNOSIS — E785 Hyperlipidemia, unspecified: Secondary | ICD-10-CM

## 2021-03-24 DIAGNOSIS — Z125 Encounter for screening for malignant neoplasm of prostate: Secondary | ICD-10-CM

## 2021-03-24 NOTE — Telephone Encounter (Signed)
Bw orders put in and pt was notified.  

## 2021-04-10 ENCOUNTER — Telehealth: Payer: Self-pay | Admitting: Family Medicine

## 2021-04-10 NOTE — Telephone Encounter (Signed)
[facility] requesting refill on Omeprazole 20 mg capsule. Take one capsule po BID for acid reflux. Pt last seen 01/23/20 for wellness; has appt coming up in August. Please advise. Thank you

## 2021-04-13 NOTE — Telephone Encounter (Signed)
Pt has appt on 04/27/21. Please advise. Thank you

## 2021-04-20 ENCOUNTER — Encounter: Payer: 59 | Admitting: Family Medicine

## 2021-04-24 LAB — BASIC METABOLIC PANEL
BUN/Creatinine Ratio: 15 (ref 9–20)
BUN: 15 mg/dL (ref 6–24)
CO2: 22 mmol/L (ref 20–29)
Calcium: 9.3 mg/dL (ref 8.7–10.2)
Chloride: 104 mmol/L (ref 96–106)
Creatinine, Ser: 1.03 mg/dL (ref 0.76–1.27)
Glucose: 100 mg/dL — ABNORMAL HIGH (ref 65–99)
Potassium: 4.5 mmol/L (ref 3.5–5.2)
Sodium: 140 mmol/L (ref 134–144)
eGFR: 87 mL/min/{1.73_m2} (ref >59)

## 2021-04-24 LAB — LIPID PANEL
Chol/HDL Ratio: 4.1 ratio (ref 0.0–5.0)
Cholesterol, Total: 181 mg/dL (ref 100–199)
HDL: 44 mg/dL (ref >39)
LDL Chol Calc (NIH): 118 mg/dL — ABNORMAL HIGH (ref 0–99)
Triglycerides: 105 mg/dL (ref 0–149)
VLDL Cholesterol Cal: 19 mg/dL (ref 5–40)

## 2021-04-24 LAB — PSA: Prostate Specific Ag, Serum: 1.5 ng/mL (ref 0.0–4.0)

## 2021-04-27 ENCOUNTER — Encounter: Payer: Self-pay | Admitting: Family Medicine

## 2021-04-27 ENCOUNTER — Other Ambulatory Visit: Payer: Self-pay

## 2021-04-27 ENCOUNTER — Ambulatory Visit (INDEPENDENT_AMBULATORY_CARE_PROVIDER_SITE_OTHER): Payer: 59 | Admitting: Family Medicine

## 2021-04-27 VITALS — BP 128/76 | HR 89 | Temp 97.7°F | Ht 66.75 in | Wt 217.0 lb

## 2021-04-27 DIAGNOSIS — K649 Unspecified hemorrhoids: Secondary | ICD-10-CM

## 2021-04-27 DIAGNOSIS — Z0000 Encounter for general adult medical examination without abnormal findings: Secondary | ICD-10-CM

## 2021-04-27 DIAGNOSIS — K219 Gastro-esophageal reflux disease without esophagitis: Secondary | ICD-10-CM | POA: Diagnosis not present

## 2021-04-27 DIAGNOSIS — Z0001 Encounter for general adult medical examination with abnormal findings: Secondary | ICD-10-CM | POA: Diagnosis not present

## 2021-04-27 DIAGNOSIS — E785 Hyperlipidemia, unspecified: Secondary | ICD-10-CM

## 2021-04-27 MED ORDER — OMEPRAZOLE 20 MG PO CPDR: DELAYED_RELEASE_CAPSULE | ORAL | 5 refills | Status: DC

## 2021-04-27 MED ORDER — PROCTOFOAM HC 1-1 % EX FOAM: 1.0000 | Freq: Two times a day (BID) | CUTANEOUS | 0 refills | Status: AC

## 2021-04-27 NOTE — Progress Notes (Signed)
Patient ID: James Barr, male    DOB: 07/21/1966, 55 y.o.   MRN: [id number]   Chief Complaint  Patient presents with   Annual Exam   Subjective:    HPI The patient comes in today for a wellness visit.  A review of their health history was completed.  A review of medications was also completed.  Any needed refills; Prilosec   Eating habits: healthy   Falls/  MVA accidents in past few months: none  Regular exercise: yes  Specialist pt sees on regular basis: none  Preventative health issues were discussed.   Additional concerns:  pt had colonoscopy back in 2020 and they detected hemorrhoids on right side. They flare up at times and pt wanting to know what can be done about them. Pt states at times he sees blood when wiping.  Had some internal hemorrhoids. Using prep H in past.  Has not had any other treatment for them. Noticing with wiping. Not having dripping in toilet or underwear.  Just occ seeing the blood with wiping.  Not having it daily.  Using stool softern.   Medical History James Barr has a past medical history of GERD (gastroesophageal reflux disease) and Nephrolithiasis.   Outpatient Encounter Medications as of 04/27/2021  Medication Sig   hydrocortisone-pramoxine (PROCTOFOAM HC) rectal foam Place 1 applicator rectally 2 (two) times daily.   Multiple Vitamin (MULTIVITAMIN) tablet Take 1 tablet by mouth daily.   naproxen sodium (ALEVE) 220 MG tablet Take 440 mg by mouth daily as needed (for pain).   [DISCONTINUED] omeprazole (PRILOSEC) 20 MG capsule TAKE 1 CAPSULE BY MOUTH TWICE DAILY FOR ACID REFLUX   omeprazole (PRILOSEC) 20 MG capsule TAKE 1 CAPSULE BY MOUTH TWICE DAILY FOR ACID REFLUX   [DISCONTINUED] etodolac (LODINE) 400 MG tablet Take 2 tablets daily in the morning with food   No facility-administered encounter medications on file as of 04/27/2021.     Review of Systems  Constitutional:  Negative for chills and fever.  HENT:  Negative  for congestion, rhinorrhea and sore throat.   Respiratory:  Negative for cough, shortness of breath and wheezing.   Cardiovascular:  Negative for chest pain and leg swelling.  Gastrointestinal:  Positive for blood in stool. Negative for abdominal pain, diarrhea, nausea and vomiting.  Genitourinary:  Negative for dysuria and frequency.  Skin:  Negative for rash.  Neurological:  Negative for dizziness, weakness and headaches.    Vitals BP 128/76   Pulse 89   Temp 97.7 F (36.5 C)   Ht 5' 6.75" (1.695 m)   Wt 217 lb (98.4 kg)   SpO2 98%   BMI 34.24 kg/m   Objective:   Physical Exam Vitals and nursing note reviewed.  Constitutional:      General: He is not in acute distress.    Appearance: Normal appearance. He is not ill-appearing.  HENT:     Head: Normocephalic.     Right Ear: Tympanic membrane, ear canal and external ear normal.     Left Ear: Tympanic membrane, ear canal and external ear normal.     Nose: Nose normal. No congestion or rhinorrhea.     Mouth/Throat:     Mouth: Mucous membranes are moist.     Pharynx: No oropharyngeal exudate or posterior oropharyngeal erythema.  Eyes:     Extraocular Movements: Extraocular movements intact.     Conjunctiva/sclera: Conjunctivae normal.     Pupils: Pupils are equal, round, and reactive to light.  Cardiovascular:  Rate and Rhythm: Normal rate and regular rhythm.     Pulses: Normal pulses.     Heart sounds: Normal heart sounds. No murmur heard. Pulmonary:     Effort: Pulmonary effort is normal. No respiratory distress.     Breath sounds: Normal breath sounds. No wheezing, rhonchi or rales.  Abdominal:     General: Abdomen is flat. Bowel sounds are normal. There is no distension.     Palpations: Abdomen is soft. There is no mass.     Tenderness: There is no abdominal tenderness. There is no guarding or rebound.     Hernia: No hernia is present.  Genitourinary:    Comments: Pt deferred rectal exam. Musculoskeletal:         General: Normal range of motion.     Cervical back: Normal range of motion.     Right lower leg: No edema.     Left lower leg: No edema.  Skin:    General: Skin is warm and dry.     Findings: No rash.  Neurological:     General: No focal deficit present.     Mental Status: He is alert and oriented to person, place, and time.     Cranial Nerves: No cranial nerve deficit.     Motor: No weakness.     Gait: Gait normal.  Psychiatric:        Mood and Affect: Mood normal.        Behavior: Behavior normal.        Thought Content: Thought content normal.        Judgment: Judgment normal.     Assessment and Plan   1. Well adult exam  2. Hyperlipidemia LDL goal <130  3. Gastroesophageal reflux disease, unspecified whether esophagitis present - hydrocortisone-pramoxine (PROCTOFOAM HC) rectal foam; Place 1 applicator rectally 2 (two) times daily.  Dispense: 10 g; Refill: 0  4. Hemorrhoids, unspecified hemorrhoid type - omeprazole (PRILOSEC) 20 MG capsule; TAKE 1 CAPSULE BY MOUTH TWICE DAILY FOR ACID REFLUX  Dispense: 60 capsule; Refill: 5   Pt to get tdap and shingrix at his pharmacy. Pt given proctofoam for hemorrhoids.  Call or rto if worsening. Inc fiber and water in diet. Cont with stool softeners.  Return in about 6 months (around 10/28/2021) for f/u gerd.

## 2021-09-21 IMAGING — CT CT RENAL STONE PROTOCOL
2 of 4 series · 16 of 46 positions shown, 18 images · non-contrast
Comparison: 11/01/2001

CLINICAL DATA: Right-sided flank pain

EXAM:
CT ABDOMEN AND PELVIS WITHOUT CONTRAST
TECHNIQUE: Multidetector CT imaging of the abdomen and pelvis was performed
following the standard protocol without IV contrast.

[Series 2: axial st · axial · 0.90mm/px · z∈[+768,+1188]mm · 13 of 97 slices shown, 15 images]
[im 7/97  soft-tissue]
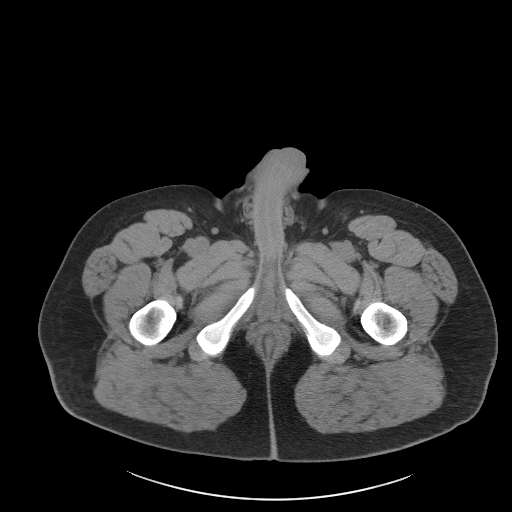
[im 7/97  bone]
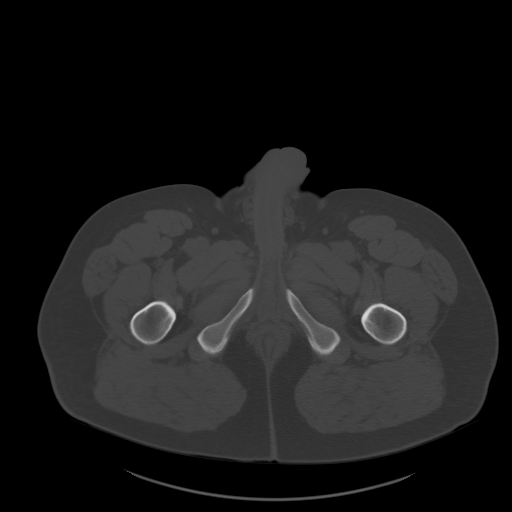
[im 13/97  soft-tissue]
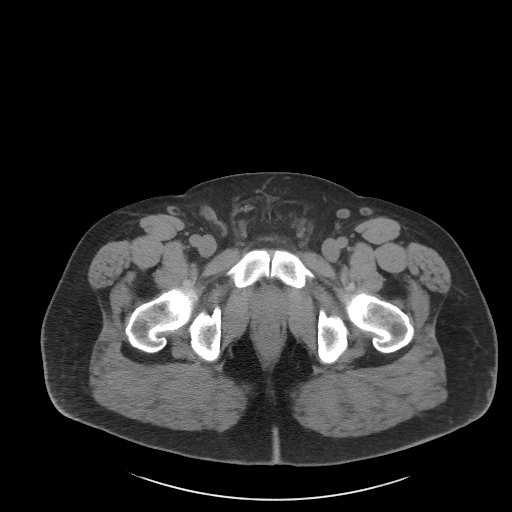
[im 19/97  soft-tissue]
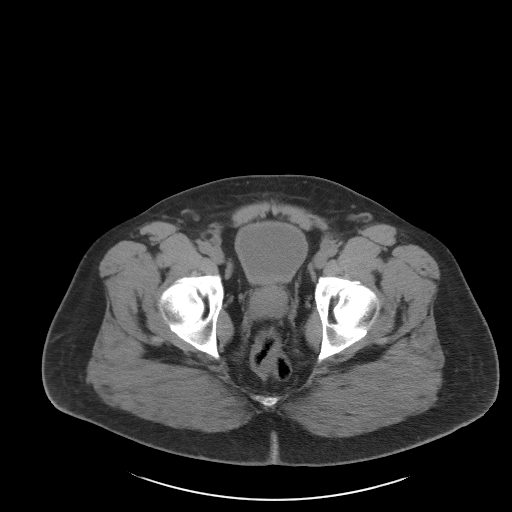
[im 31/97  soft-tissue]
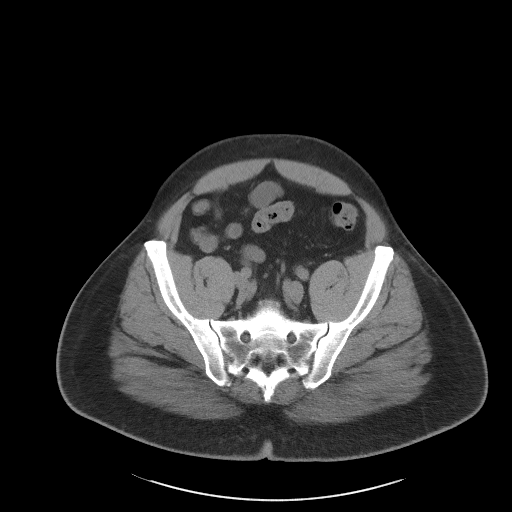
[im 37/97  soft-tissue]
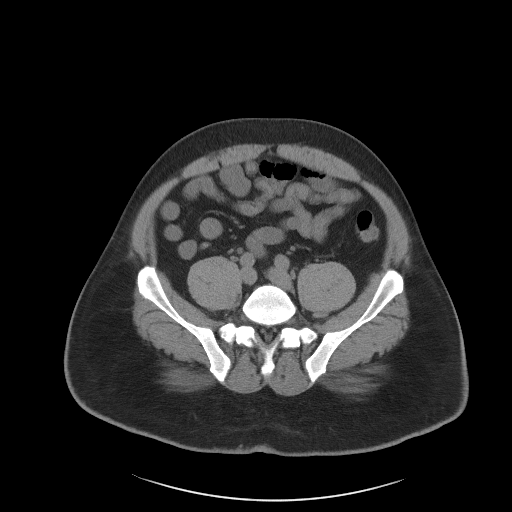
[im 43/97  soft-tissue]
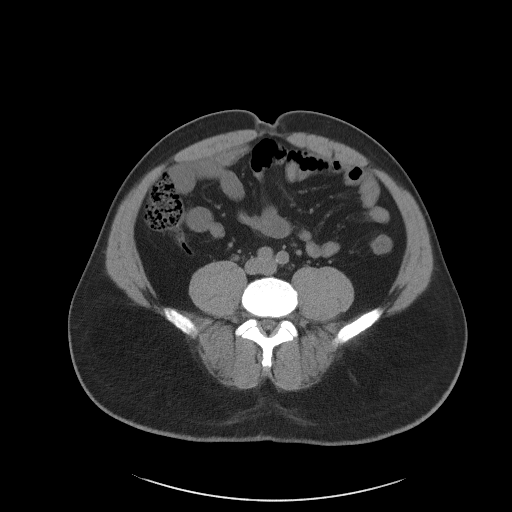
[im 49/97  soft-tissue]
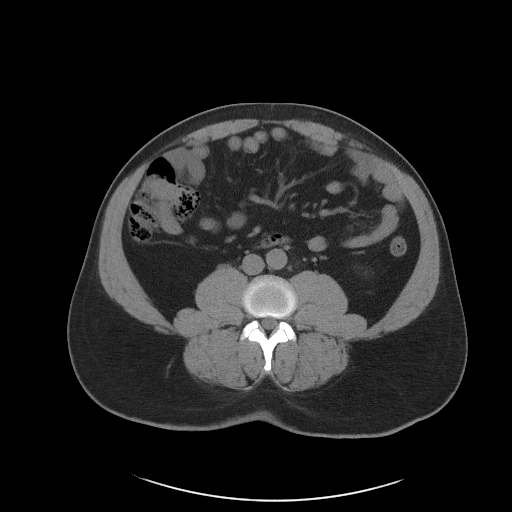
[im 55/97  soft-tissue]
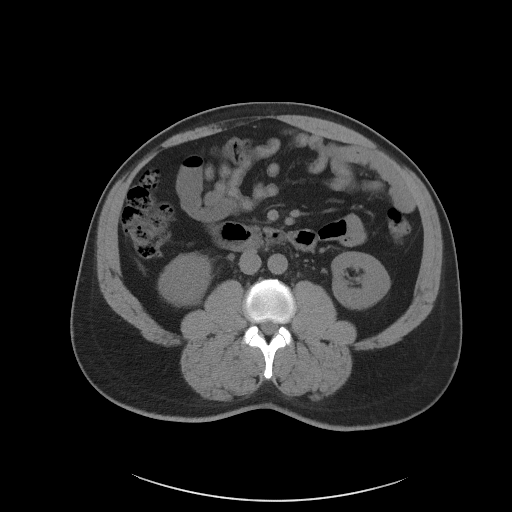
[im 61/97  soft-tissue]
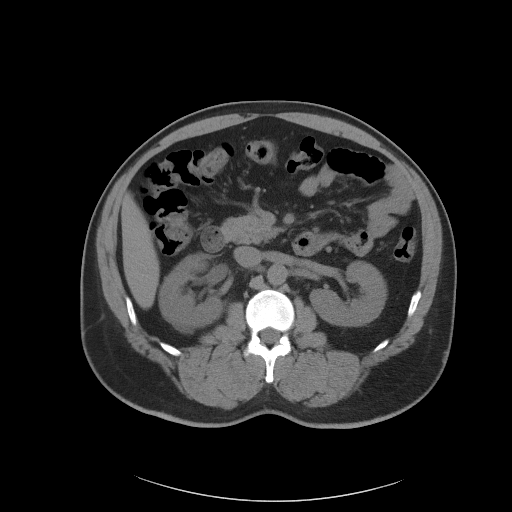
[im 61/97  bone]
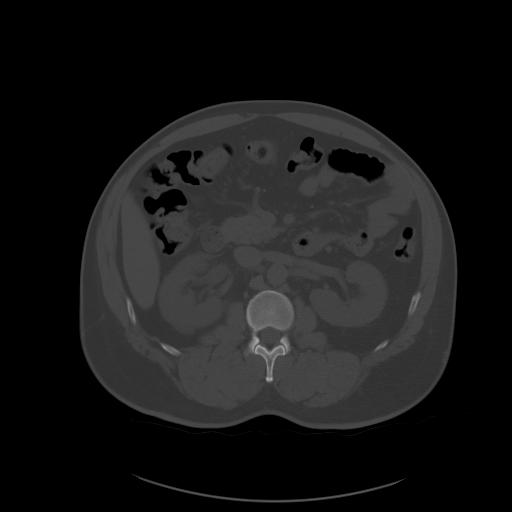
[im 67/97  soft-tissue]
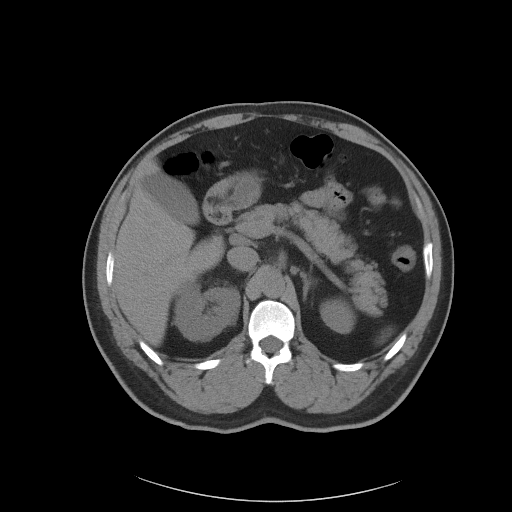
[im 79/97  soft-tissue]
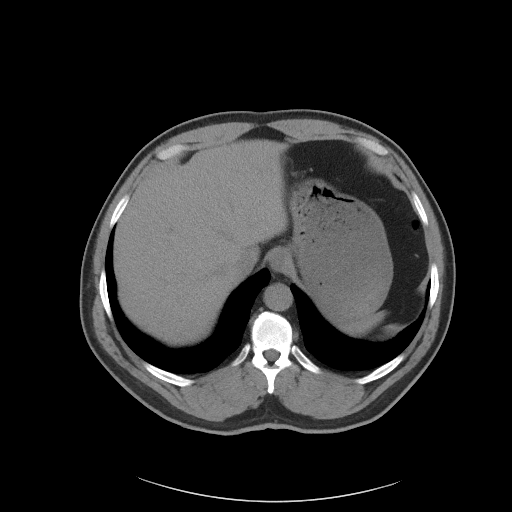
[im 85/97  soft-tissue]
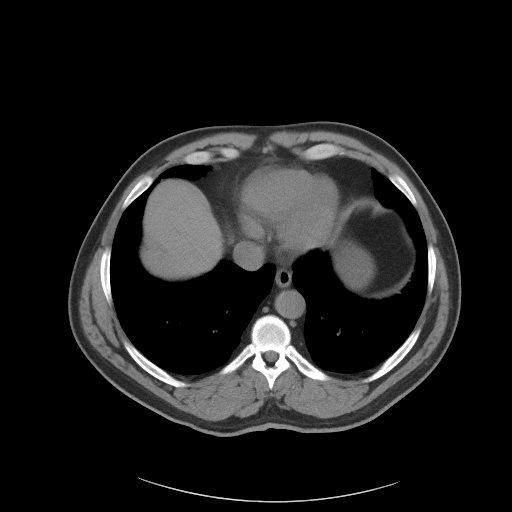
[im 91/97  soft-tissue]
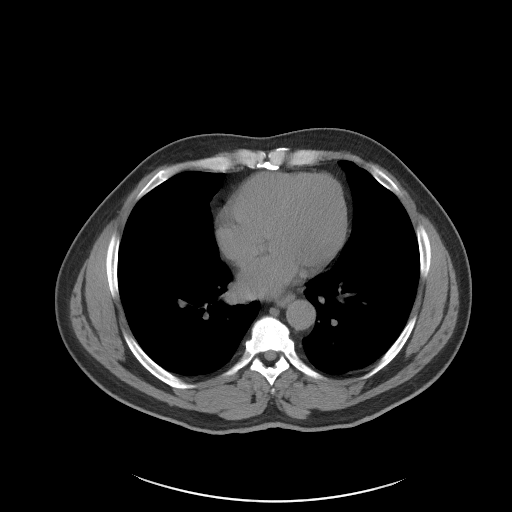

[Series 5: coronal st · coronal · 0.81mm/px · 3 of 116 slices shown]
[im 39/116  soft-tissue]
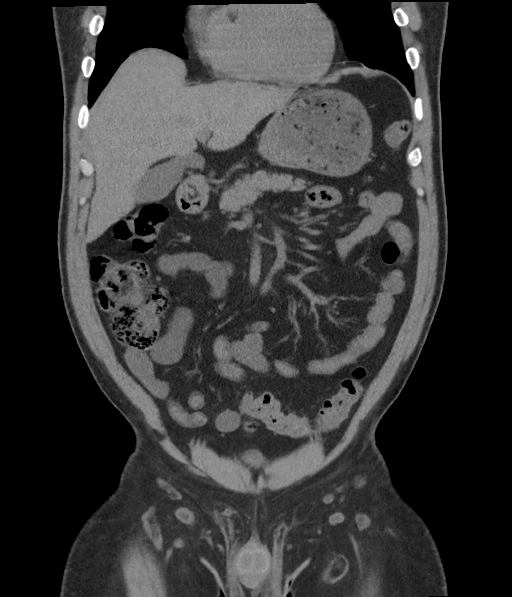
[im 52/116  soft-tissue]
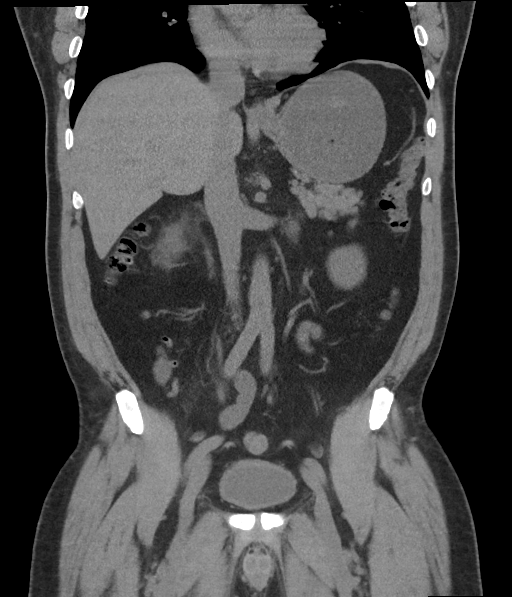
[im 64/116  soft-tissue]
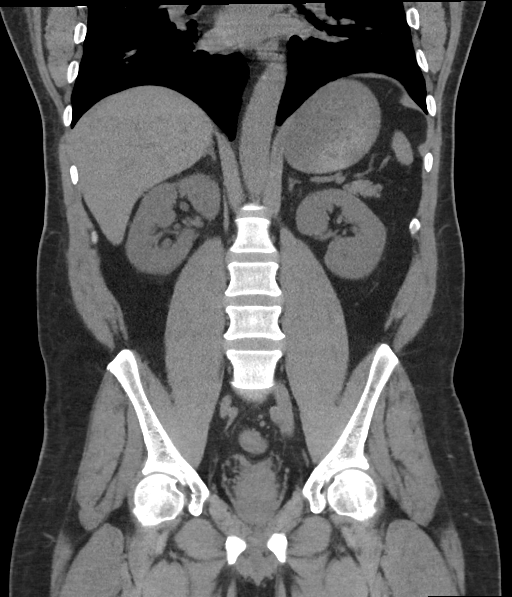

[16 of 46 positions shown; findings below may reference images not displayed]

FINDINGS: Lower chest: No acute abnormality.

Hepatobiliary: No focal liver abnormality is seen. No gallstones,
gallbladder wall thickening, or biliary dilatation.

Pancreas: Unremarkable. No pancreatic ductal dilatation or
surrounding inflammatory changes.

Spleen: Normal in size without focal abnormality.

Adrenals/Urinary Tract: Adrenal glands are within normal limits.
Left kidney is within normal limits without renal calculi or
obstructive changes. The bladder is decompressed. Right kidney
demonstrates generalized edema with decreased attenuation and mild
hydronephrosis and hydroureter. A tiny nonobstructing 1 mm stone is
noted within the lower pole of the right kidney. The ureter is
dilated to the level of the distal ureter at which point a small 1
mm stone is noted best seen on images 75 of series 2 and 60 of
series 5.

Stomach/Bowel: The appendix is well visualized and within normal
limits. No obstructive or inflammatory changes of the colon are
seen. The stomach is within normal limits. No small bowel
abnormality is seen.

Vascular/Lymphatic: Aortic atherosclerosis. No enlarged abdominal or
pelvic lymph nodes.

Reproductive: Prostate is unremarkable.

Other: No abdominal wall hernia or abnormality. No abdominopelvic
ascites.

Musculoskeletal: No acute or significant osseous findings.
IMPRESSION: Tiny 1 mm distal right ureteral stone with mild hydronephrosis.

Tiny 1 mm nonobstructing stone in the lower pole of the right
kidney.

No other focal abnormality is noted.

## 2022-03-26 ENCOUNTER — Telehealth: Payer: Self-pay | Admitting: Family Medicine

## 2022-03-26 NOTE — Telephone Encounter (Signed)
Last labs 04/23/21: Lipid, Met 7 and PSA

## 2022-03-26 NOTE — Telephone Encounter (Signed)
Patient is having a physical on 8/25 and needing labs done

## 2022-03-29 ENCOUNTER — Other Ambulatory Visit: Payer: Self-pay | Admitting: Family Medicine

## 2022-03-29 DIAGNOSIS — E785 Hyperlipidemia, unspecified: Secondary | ICD-10-CM | POA: Insufficient documentation

## 2022-03-29 DIAGNOSIS — Z130 Encounter for screening for diseases of the blood and blood-forming organs and certain disorders involving the immune mechanism: Secondary | ICD-10-CM

## 2022-03-29 DIAGNOSIS — Z125 Encounter for screening for malignant neoplasm of prostate: Secondary | ICD-10-CM

## 2022-03-29 DIAGNOSIS — R7303 Prediabetes: Secondary | ICD-10-CM

## 2022-03-31 NOTE — Telephone Encounter (Signed)
James Sams, DO     Orders placed

## 2022-03-31 NOTE — Telephone Encounter (Signed)
Patient notified

## 2022-03-31 NOTE — Telephone Encounter (Signed)
Left message to return call 

## 2022-05-04 LAB — HEMOGLOBIN A1C
Est. average glucose Bld gHb Est-mCnc: 120 mg/dL
Hgb A1c MFr Bld: 5.8 % — ABNORMAL HIGH (ref 4.8–5.6)

## 2022-05-04 LAB — LIPID PANEL
Chol/HDL Ratio: 4.2 ratio (ref 0.0–5.0)
Cholesterol, Total: 197 mg/dL (ref 100–199)
HDL: 47 mg/dL (ref >39)
LDL Chol Calc (NIH): 135 mg/dL — ABNORMAL HIGH (ref 0–99)
Triglycerides: 82 mg/dL (ref 0–149)
VLDL Cholesterol Cal: 15 mg/dL (ref 5–40)

## 2022-05-04 LAB — CBC
Hematocrit: 45.1 % (ref 37.5–51.0)
Hemoglobin: 15.3 g/dL (ref 13.0–17.7)
MCH: 32.5 pg (ref 26.6–33.0)
MCHC: 33.9 g/dL (ref 31.5–35.7)
MCV: 96 fL (ref 79–97)
Platelets: 221 10*3/uL (ref 150–450)
RBC: 4.71 x10E6/uL (ref 4.14–5.80)
RDW: 11.8 % (ref 11.6–15.4)
WBC: 5.2 10*3/uL (ref 3.4–10.8)

## 2022-05-04 LAB — CMP14+EGFR
ALT: 20 IU/L (ref 0–44)
AST: 18 IU/L (ref 0–40)
Albumin/Globulin Ratio: 1.5 (ref 1.2–2.2)
Albumin: 4.2 g/dL (ref 3.8–4.9)
Alkaline Phosphatase: 71 IU/L (ref 44–121)
BUN/Creatinine Ratio: 15 (ref 9–20)
BUN: 15 mg/dL (ref 6–24)
Bilirubin Total: 0.4 mg/dL (ref 0.0–1.2)
CO2: 21 mmol/L (ref 20–29)
Calcium: 9.6 mg/dL (ref 8.7–10.2)
Chloride: 104 mmol/L (ref 96–106)
Creatinine, Ser: 0.99 mg/dL (ref 0.76–1.27)
Globulin, Total: 2.8 g/dL (ref 1.5–4.5)
Glucose: 111 mg/dL — ABNORMAL HIGH (ref 70–99)
Potassium: 4.6 mmol/L (ref 3.5–5.2)
Sodium: 139 mmol/L (ref 134–144)
Total Protein: 7 g/dL (ref 6.0–8.5)
eGFR: 91 mL/min/{1.73_m2} (ref >59)

## 2022-05-04 LAB — PSA: Prostate Specific Ag, Serum: 1.8 ng/mL (ref 0.0–4.0)

## 2022-05-06 ENCOUNTER — Other Ambulatory Visit: Payer: Self-pay | Admitting: Family Medicine

## 2022-05-06 MED ORDER — ROSUVASTATIN CALCIUM 10 MG PO TABS: 10.0000 mg | ORAL_TABLET | Freq: Every day | ORAL | 3 refills | Status: DC

## 2022-05-07 ENCOUNTER — Ambulatory Visit (INDEPENDENT_AMBULATORY_CARE_PROVIDER_SITE_OTHER): Payer: 59 | Admitting: Family Medicine

## 2022-05-07 VITALS — BP 120/80 | HR 70 | Ht 66.75 in | Wt 206.6 lb

## 2022-05-07 DIAGNOSIS — K219 Gastro-esophageal reflux disease without esophagitis: Secondary | ICD-10-CM | POA: Diagnosis not present

## 2022-05-07 DIAGNOSIS — Z0000 Encounter for general adult medical examination without abnormal findings: Secondary | ICD-10-CM | POA: Diagnosis not present

## 2022-05-07 MED ORDER — OMEPRAZOLE 20 MG PO CPDR: 20.0000 mg | DELAYED_RELEASE_CAPSULE | Freq: Two times a day (BID) | ORAL | 3 refills | Status: DC

## 2022-05-07 NOTE — Assessment & Plan Note (Signed)
Declines HIV and hepatitis C screening. Labs reviewed today. Patient will consider getting shingles vaccine. Start statin. Follow-up annually.

## 2022-05-07 NOTE — Progress Notes (Signed)
Subjective:  Patient ID: James Barr, male    DOB: 1965/11/12  Age: 56 y.o. MRN: [id number]  CC: Chief Complaint  Patient presents with   Annual Exam    Discuss recent labs  Needs refill omeprazole No problems or concerns    HPI:  56 year old male presents for an annual physical exam.  Patient states that he is doing well.  Labs obtained prior to his visit today.  Notable for A1c of 5.8 and elevated LDL of 135.  I have started the patient on Crestor.  He will be picking it up today.  Patient states that he has no complaints at this time.  No chest pain or shortness of breath.  No abdominal pain.  No issues with his vision.  Normal bowel movements.  He is up-to-date on colonoscopy.  Declines HIV and hepatitis C screening.  We discussed shingles vaccine today.  He will consider getting this at his local pharmacy.  Prostate cancer screening with PSA was normal.  Patient Active Problem List   Diagnosis Date Noted   Gastroesophageal reflux disease without esophagitis 05/07/2022   Annual physical exam 05/07/2022   Hyperlipidemia 03/29/2022   Prediabetes 06/20/2018    Social Hx   Social History   Socioeconomic History   Marital status: Married    Spouse name: Not on file   Number of children: Not on file   Years of education: Not on file   Highest education level: Not on file  Occupational History   Not on file  Tobacco Use   Smoking status: Light Smoker    Types: Cigars   Smokeless tobacco: Never  Vaping Use   Vaping Use: Never used  Substance and Sexual Activity   Alcohol use: Yes    Alcohol/week: 0.0 standard drinks of alcohol    Comment: glass of wine on occasion   Drug use: No   Sexual activity: Not on file  Other Topics Concern   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   Social Determinants of Health   Financial Resource Strain: Not on file  Food Insecurity: Not on file  Transportation Needs: Not on  file  Physical Activity: Not on file  Stress: Not on file  Social Connections: Not on file    Review of Systems Per HPI  Objective:  BP 120/80   Pulse 70   Ht 5' 6.75" (1.695 m)   Wt 206 lb 9.6 oz (93.7 kg)   BMI 32.60 kg/m      05/07/2022    1:27 PM 04/27/2021    9:30 AM 01/23/2020   10:28 AM  BP/Weight  Systolic BP 120 128 116  Diastolic BP 80 76 80  Wt. (Lbs) 206.6 217 211.2  BMI 32.6 kg/m2 34.24 kg/m2 34.09 kg/m2    Physical Exam Vitals and nursing note reviewed.  Constitutional:      General: He is not in acute distress.    Appearance: Normal appearance.  HENT:     Head: Normocephalic and atraumatic.     Right Ear: Tympanic membrane normal.     Left Ear: Tympanic membrane normal.     Mouth/Throat:     Pharynx: Oropharynx is clear.  Eyes:     General:        Right eye: No discharge.        Left eye: No discharge.     Conjunctiva/sclera: Conjunctivae normal.  Cardiovascular:     Rate and Rhythm:  Normal rate and regular rhythm.     Heart sounds: No murmur heard. Pulmonary:     Effort: Pulmonary effort is normal.     Breath sounds: Normal breath sounds. No wheezing, rhonchi or rales.  Abdominal:     General: There is no distension.     Palpations: Abdomen is soft.     Tenderness: There is no abdominal tenderness.  Neurological:     Mental Status: He is alert.  Psychiatric:        Mood and Affect: Mood normal.        Behavior: Behavior normal.     Lab Results  Component Value Date   WBC 5.2 05/03/2022   HGB 15.3 05/03/2022   HCT 45.1 05/03/2022   PLT 221 05/03/2022   GLUCOSE 111 (H) 05/03/2022   CHOL 197 05/03/2022   TRIG 82 05/03/2022   HDL 47 05/03/2022   LDLCALC 135 (H) 05/03/2022   ALT 20 05/03/2022   AST 18 05/03/2022   NA 139 05/03/2022   K 4.6 05/03/2022   CL 104 05/03/2022   CREATININE 0.99 05/03/2022   BUN 15 05/03/2022   CO2 21 05/03/2022   PSA 0.82 05/19/2013   HGBA1C 5.8 (H) 05/03/2022     Assessment & Plan:   Problem  List Items Addressed This Visit       Digestive   Gastroesophageal reflux disease without esophagitis   Relevant Medications   omeprazole (PRILOSEC) 20 MG capsule     Other   Annual physical exam - Primary    Declines HIV and hepatitis C screening. Labs reviewed today. Patient will consider getting shingles vaccine. Start statin. Follow-up annually.       Meds ordered this encounter  Medications   omeprazole (PRILOSEC) 20 MG capsule    Sig: Take 1 capsule (20 mg total) by mouth 2 (two) times daily before a meal.    Dispense:  180 capsule    Refill:  3    Follow-up:  Return in about 1 year (around 05/08/2023).  Everlene Other DO [facility]

## 2022-05-07 NOTE — Patient Instructions (Signed)
Medication as prescribed.  Follow up annually.  Take care  Dr. Sharunda Salmon  

## 2023-05-16 ENCOUNTER — Other Ambulatory Visit: Payer: Self-pay | Admitting: Family Medicine

## 2023-05-16 DIAGNOSIS — K219 Gastro-esophageal reflux disease without esophagitis: Secondary | ICD-10-CM

## 2023-05-20 ENCOUNTER — Telehealth: Payer: Self-pay | Admitting: Family Medicine

## 2023-05-20 DIAGNOSIS — Z125 Encounter for screening for malignant neoplasm of prostate: Secondary | ICD-10-CM

## 2023-05-20 DIAGNOSIS — R7303 Prediabetes: Secondary | ICD-10-CM

## 2023-05-20 DIAGNOSIS — E785 Hyperlipidemia, unspecified: Secondary | ICD-10-CM

## 2023-05-20 DIAGNOSIS — Z130 Encounter for screening for diseases of the blood and blood-forming organs and certain disorders involving the immune mechanism: Secondary | ICD-10-CM

## 2023-05-20 NOTE — Telephone Encounter (Signed)
Patient has appointment for physical on 9/19  would like to get labs done before appointment.

## 2023-05-30 ENCOUNTER — Other Ambulatory Visit: Payer: Self-pay | Admitting: Family Medicine

## 2023-05-30 NOTE — Telephone Encounter (Signed)
Blood work ordered in [employer]. Patient notified.

## 2023-05-30 NOTE — Telephone Encounter (Signed)
Tommie Sams, DO  to Trinidad Curet, [facility]     05/23/23  9:13 AM CBC, CMP, Lipid, A1C, PSA.

## 2023-05-31 LAB — CMP14+EGFR
ALT: 22 IU/L (ref 0–44)
AST: 17 IU/L (ref 0–40)
Albumin: 4.1 g/dL (ref 3.8–4.9)
Alkaline Phosphatase: 75 IU/L (ref 44–121)
BUN/Creatinine Ratio: 11 (ref 9–20)
BUN: 11 mg/dL (ref 6–24)
Bilirubin Total: 0.7 mg/dL (ref 0.0–1.2)
CO2: 20 mmol/L (ref 20–29)
Calcium: 9.4 mg/dL (ref 8.7–10.2)
Chloride: 103 mmol/L (ref 96–106)
Creatinine, Ser: 0.96 mg/dL (ref 0.76–1.27)
Globulin, Total: 3 g/dL (ref 1.5–4.5)
Glucose: 103 mg/dL — ABNORMAL HIGH (ref 70–99)
Potassium: 4.8 mmol/L (ref 3.5–5.2)
Sodium: 135 mmol/L (ref 134–144)
Total Protein: 7.1 g/dL (ref 6.0–8.5)
eGFR: 93 mL/min/{1.73_m2} (ref >59)

## 2023-05-31 LAB — CBC WITH DIFFERENTIAL/PLATELET
Basophils Absolute: 0.1 10*3/uL (ref 0.0–0.2)
Basos: 2 %
EOS (ABSOLUTE): 0.1 10*3/uL (ref 0.0–0.4)
Eos: 3 %
Hematocrit: 43.2 % (ref 37.5–51.0)
Hemoglobin: 14.5 g/dL (ref 13.0–17.7)
Immature Grans (Abs): 0 10*3/uL (ref 0.0–0.1)
Immature Granulocytes: 0 %
Lymphocytes Absolute: 2 10*3/uL (ref 0.7–3.1)
Lymphs: 39 %
MCH: 32.6 pg (ref 26.6–33.0)
MCHC: 33.6 g/dL (ref 31.5–35.7)
MCV: 97 fL (ref 79–97)
Monocytes Absolute: 0.5 10*3/uL (ref 0.1–0.9)
Monocytes: 10 %
Neutrophils Absolute: 2.5 10*3/uL (ref 1.4–7.0)
Neutrophils: 46 %
Platelets: 244 10*3/uL (ref 150–450)
RBC: 4.45 x10E6/uL (ref 4.14–5.80)
RDW: 12.4 % (ref 11.6–15.4)
WBC: 5.2 10*3/uL (ref 3.4–10.8)

## 2023-05-31 LAB — PSA: Prostate Specific Ag, Serum: 2.5 ng/mL (ref 0.0–4.0)

## 2023-05-31 LAB — LIPID PANEL
Chol/HDL Ratio: 4.9 ratio (ref 0.0–5.0)
Cholesterol, Total: 202 mg/dL — ABNORMAL HIGH (ref 100–199)
HDL: 41 mg/dL (ref >39)
LDL Chol Calc (NIH): 139 mg/dL — ABNORMAL HIGH (ref 0–99)
Triglycerides: 121 mg/dL (ref 0–149)
VLDL Cholesterol Cal: 22 mg/dL (ref 5–40)

## 2023-05-31 LAB — HEMOGLOBIN A1C
Est. average glucose Bld gHb Est-mCnc: 123 mg/dL
Hgb A1c MFr Bld: 5.9 % — ABNORMAL HIGH (ref 4.8–5.6)

## 2023-05-31 LAB — LAB REPORT - SCANNED
A1c: 5.9
EGFR: 93

## 2023-06-02 ENCOUNTER — Ambulatory Visit: Payer: 59 | Admitting: Family Medicine

## 2023-06-02 ENCOUNTER — Encounter: Payer: Self-pay | Admitting: Family Medicine

## 2023-06-02 VITALS — BP 119/79 | HR 69 | Temp 97.7°F | Wt 206.0 lb

## 2023-06-02 DIAGNOSIS — K219 Gastro-esophageal reflux disease without esophagitis: Secondary | ICD-10-CM

## 2023-06-02 DIAGNOSIS — Z0001 Encounter for general adult medical examination with abnormal findings: Secondary | ICD-10-CM

## 2023-06-02 DIAGNOSIS — Z0000 Encounter for general adult medical examination without abnormal findings: Secondary | ICD-10-CM

## 2023-06-02 MED ORDER — OMEPRAZOLE 20 MG PO CPDR
20.0000 mg | DELAYED_RELEASE_CAPSULE | Freq: Every day | ORAL | 3 refills | Status: DC | PRN
Start: 2023-06-02 — End: 2024-06-04

## 2023-06-02 MED ORDER — ROSUVASTATIN CALCIUM 10 MG PO TABS: 10.0000 mg | ORAL_TABLET | Freq: Every day | ORAL | 3 refills | Status: DC

## 2023-06-02 NOTE — Patient Instructions (Signed)
Continue your medications. Follow up annually.  Take care  Dr. Adriana Simas

## 2023-06-02 NOTE — Progress Notes (Signed)
Subjective:  Patient ID: James Barr, male    DOB: 10/22/1965  Age: 57 y.o. MRN: [id number]  CC: Chief Complaint  Patient presents with   PHYsical    HPI:  57 year old male with hyperlipidemia, GERD, prediabetes presents for an annual physical exam.  Patient is doing well.  He has no complaints or concerns at this time.  Unsure of last Tdap.  Previously recommended to get shingles vaccine.  He declines COVID-vaccine.  He states that he needs a refill on his omeprazole.   Patient Active Problem List   Diagnosis Date Noted   Gastroesophageal reflux disease without esophagitis 05/07/2022   Annual physical exam 05/07/2022   Hyperlipidemia 03/29/2022   Prediabetes 06/20/2018    Social Hx   Social History   Socioeconomic History   Marital status: Married    Spouse name: Not on file   Number of children: Not on file   Years of education: Not on file   Highest education level: Not on file  Occupational History   Not on file  Tobacco Use   Smoking status: Light Smoker    Types: Cigars   Smokeless tobacco: Never  Vaping Use   Vaping status: Never Used  Substance and Sexual Activity   Alcohol use: Yes    Alcohol/week: 0.0 standard drinks of alcohol    Comment: glass of wine on occasion   Drug use: No   Sexual activity: Not on file  Other Topics Concern   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   Social Determinants of Health   Financial Resource Strain: Not on file  Food Insecurity: Not on file  Transportation Needs: Not on file  Physical Activity: Not on file  Stress: Not on file  Social Connections: Not on file    Review of Systems  Respiratory: Negative.    Cardiovascular: Negative.     Objective:  BP 119/79   Pulse 69   Temp 97.7 F (36.5 C)   Wt 206 lb (93.4 kg)   SpO2 99%   BMI 32.51 kg/m      06/02/2023    9:03 AM 05/07/2022    1:27 PM 04/27/2021    9:30 AM  BP/Weight  Systolic BP  119 401 128  Diastolic BP 79 80 76  Wt. (Lbs) 206 206.6 217  BMI 32.51 kg/m2 32.6 kg/m2 34.24 kg/m2    Physical Exam Vitals and nursing note reviewed.  Constitutional:      General: He is not in acute distress.    Appearance: Normal appearance.  HENT:     Head: Normocephalic and atraumatic.  Eyes:     General:        Right eye: No discharge.        Left eye: No discharge.     Conjunctiva/sclera: Conjunctivae normal.  Cardiovascular:     Rate and Rhythm: Normal rate and regular rhythm.  Pulmonary:     Effort: Pulmonary effort is normal.     Breath sounds: Normal breath sounds. No wheezing, rhonchi or rales.  Neurological:     Mental Status: He is alert.  Psychiatric:        Mood and Affect: Mood normal.        Behavior: Behavior normal.     Lab Results  Component Value Date   WBC 5.2 05/03/2022   HGB 15.3 05/03/2022   HCT 45.1 05/03/2022   PLT 221 05/03/2022   GLUCOSE 111 (H)  05/03/2022   CHOL 197 05/03/2022   TRIG 82 05/03/2022   HDL 47 05/03/2022   LDLCALC 135 (H) 05/03/2022   ALT 20 05/03/2022   AST 18 05/03/2022   NA 139 05/03/2022   K 4.6 05/03/2022   CL 104 05/03/2022   CREATININE 0.99 05/03/2022   BUN 15 05/03/2022   CO2 21 05/03/2022   PSA 0.82 05/19/2013   HGBA1C 5.8 (H) 05/03/2022     Assessment & Plan:   Problem List Items Addressed This Visit       Digestive   Gastroesophageal reflux disease without esophagitis   Relevant Medications   omeprazole (PRILOSEC) 20 MG capsule     Other   Annual physical exam - Primary    Doing well at this time.  Patient's labs were reviewed after his visit was complete due to the fact that he was not in our system.  [facility] gave me a physical copy.  His labs are remarkable for elevated cholesterol and LDL.  Prediabetes is stable.  I have sent in Crestor for him today.  Will reach out to him regarding compliance. Omeprazole refilled. Discussed his preventative health care items. Follow-up annually.        Meds ordered this encounter  Medications   rosuvastatin (CRESTOR) 10 MG tablet    Sig: Take 1 tablet (10 mg total) by mouth daily.    Dispense:  90 tablet    Refill:  3   omeprazole (PRILOSEC) 20 MG capsule    Sig: Take 1 capsule (20 mg total) by mouth daily as needed.    Dispense:  90 capsule    Refill:  3    Follow-up:  Annually  Everlene Other DO [facility]

## 2023-06-02 NOTE — Assessment & Plan Note (Signed)
Doing well at this time.  Patient's labs were reviewed after his visit was complete due to the fact that he was not in our system.  [facility] gave me a physical copy.  His labs are remarkable for elevated cholesterol and LDL.  Prediabetes is stable.  I have sent in Crestor for him today.  Will reach out to him regarding compliance. Omeprazole refilled. Discussed his preventative health care items. Follow-up annually.

## 2023-06-04 ENCOUNTER — Other Ambulatory Visit: Payer: Self-pay | Admitting: Family Medicine

## 2023-06-04 DIAGNOSIS — K219 Gastro-esophageal reflux disease without esophagitis: Secondary | ICD-10-CM

## 2023-06-26 ENCOUNTER — Other Ambulatory Visit: Payer: Self-pay | Admitting: Family Medicine

## 2024-05-18 ENCOUNTER — Encounter: Payer: Self-pay | Admitting: Family Medicine

## 2024-05-18 ENCOUNTER — Ambulatory Visit: Payer: Self-pay

## 2024-05-18 ENCOUNTER — Ambulatory Visit: Admitting: Family Medicine

## 2024-05-18 ENCOUNTER — Ambulatory Visit: Payer: Self-pay | Admitting: Family Medicine

## 2024-05-18 VITALS — BP 117/75 | HR 78 | Temp 97.5°F | Ht 66.75 in | Wt 217.0 lb

## 2024-05-18 DIAGNOSIS — N200 Calculus of kidney: Secondary | ICD-10-CM | POA: Diagnosis not present

## 2024-05-18 DIAGNOSIS — R319 Hematuria, unspecified: Secondary | ICD-10-CM

## 2024-05-18 LAB — POCT URINALYSIS DIP (CLINITEK)
Bilirubin, UA: NEGATIVE
Glucose, UA: NEGATIVE mg/dL
Ketones, POC UA: NEGATIVE mg/dL
Leukocytes, UA: NEGATIVE
Nitrite, UA: NEGATIVE
Spec Grav, UA: 1.02 (ref 1.010–1.025)
Urobilinogen, UA: 0.2 U/dL
pH, UA: 7 (ref 5.0–8.0)

## 2024-05-18 MED ORDER — TAMSULOSIN HCL 0.4 MG PO CAPS: 0.4000 mg | ORAL_CAPSULE | Freq: Every day | ORAL | 1 refills | Status: AC

## 2024-05-18 MED ORDER — OXYCODONE-ACETAMINOPHEN 5-325 MG PO TABS: 1.0000 | ORAL_TABLET | ORAL | 0 refills | Status: AC | PRN

## 2024-05-18 NOTE — Telephone Encounter (Signed)
 FYI Only or Action Required?: FYI only for provider.  Patient was last seen in primary care on 06/02/2023 by Cook, Jayce G, DO.  Called Nurse Triage reporting Hematuria.  Symptoms began yesterday.  Interventions attempted: Tylenol .  Symptoms are: unchanged.  Triage Disposition: See Physician Within 24 Hours - Same day visit scheduled  Patient/caregiver understands and will follow disposition?: Yes Reason for Disposition  Side (flank) or back pain present  Answer Assessment - Initial Assessment Questions Pt reports hematuria yesterday and this morning. States on Wed, he had lower back discomfort that felt like a pulled muscle.   1. COLOR of URINE:      Light red to pink  2. ONSET: When did the bleeding start?      Yesterday  3. EPISODES: How many times has there been blood in the urine? or How many times today?     2  4. PAIN with URINATION: Is there any pain with passing your urine? If Yes, ask: How bad is the pain?  (Scale 1-10; or mild, moderate, severe)     Denies  5. FEVER: Do you have a fever? If Yes, ask: What is your temperature, how was it measured, and when did it start?     Denies  6. OTHER SYMPTOMS: Do you have any other symptoms? (e.g., back/flank pain, abdomen pain, vomiting, increased frequency)     Flank pain  Protocols used: Urine - Blood In-A-AH Copied from CRM #[id number]. Topic: Clinical - Red Word Triage >> May 18, 2024  9:23 AM Turkey B wrote: Patient had blood in urine yesterday

## 2024-05-18 NOTE — Progress Notes (Signed)
   Subjective:    Patient ID: James Barr, male    DOB: 21-Jun-1966, 58 y.o.   MRN: [id number]  HPI Hx of kidney stones twice Pain in right side lower back since about Tuesday 3 to 4 days ago Did notice blood in urine light to heavy w clots  Patient with flank pain discomfort on the right side pressure feeling kept him awake denies high fever chills sweats denies dysuria states he did see blood in the urine he has had a history of kidney stones.  Denies any other interventions or problems  Review of Systems     Objective:   Physical Exam General-in no acute distress Eyes-no discharge Lungs-respiratory rate normal, CTA CV-no murmurs,RRR Extremities skin warm dry no edema Neuro grossly normal Behavior normal, alert Mild tenderness on the right side flank region no guarding or rebound   The microscope no WBCs TNTC RBC no obvious bacteria    Assessment & Plan:  Probable kidney stone Recommend KUB today Tamsulosin  each evening Oxycodone  for severe pain caution drowsiness do not drive with medicine Warning signs regarding progressive symptoms or worsening issues discussed follow-up if progressive symptoms Patient to give us  update in a few days time how he is doing if he is not doing better or getting worse CT scan would be indicated warning signs when to go the ER was discussed in detail patient understood

## 2024-05-18 NOTE — Telephone Encounter (Signed)
 Appointment scheduled today with Dr Glendia

## 2024-05-19 ENCOUNTER — Ambulatory Visit (HOSPITAL_COMMUNITY)
Admission: RE | Admit: 2024-05-19 | Discharge: 2024-05-19 | Disposition: A | Discharge status: Home / Self Care | Source: Ambulatory Visit | Attending: Family Medicine | Admitting: Family Medicine

## 2024-05-19 DIAGNOSIS — N200 Calculus of kidney: Secondary | ICD-10-CM | POA: Insufficient documentation

## 2024-05-19 DIAGNOSIS — R319 Hematuria, unspecified: Secondary | ICD-10-CM | POA: Diagnosis present

## 2024-06-04 ENCOUNTER — Ambulatory Visit (INDEPENDENT_AMBULATORY_CARE_PROVIDER_SITE_OTHER): Admitting: Family Medicine

## 2024-06-04 VITALS — BP 121/78 | HR 96 | Ht 66.75 in | Wt 212.0 lb

## 2024-06-04 DIAGNOSIS — Z130 Encounter for screening for diseases of the blood and blood-forming organs and certain disorders involving the immune mechanism: Secondary | ICD-10-CM

## 2024-06-04 DIAGNOSIS — E785 Hyperlipidemia, unspecified: Secondary | ICD-10-CM

## 2024-06-04 DIAGNOSIS — Z0001 Encounter for general adult medical examination with abnormal findings: Secondary | ICD-10-CM | POA: Diagnosis not present

## 2024-06-04 DIAGNOSIS — R7303 Prediabetes: Secondary | ICD-10-CM | POA: Diagnosis not present

## 2024-06-04 DIAGNOSIS — Z0000 Encounter for general adult medical examination without abnormal findings: Secondary | ICD-10-CM

## 2024-06-04 DIAGNOSIS — Z23 Encounter for immunization: Secondary | ICD-10-CM | POA: Diagnosis not present

## 2024-06-04 DIAGNOSIS — K219 Gastro-esophageal reflux disease without esophagitis: Secondary | ICD-10-CM

## 2024-06-04 DIAGNOSIS — Z125 Encounter for screening for malignant neoplasm of prostate: Secondary | ICD-10-CM

## 2024-06-04 MED ORDER — ROSUVASTATIN CALCIUM 10 MG PO TABS: 10.0000 mg | ORAL_TABLET | Freq: Every day | ORAL | 3 refills | Status: AC

## 2024-06-04 MED ORDER — OMEPRAZOLE 20 MG PO CPDR: 20.0000 mg | DELAYED_RELEASE_CAPSULE | Freq: Every day | ORAL | 3 refills | Status: AC | PRN

## 2024-06-04 NOTE — Patient Instructions (Signed)
 Labs today.  Increase fiber in diet.   If this continues to bother you please let me know.  Follow up annually.

## 2024-06-04 NOTE — Progress Notes (Signed)
 Subjective:  Patient ID: James Barr, male    DOB: 08-20-1966  Age: 58 y.o. MRN: [id number]  CC:   Chief Complaint  Patient presents with   Annual Exam    HPI:  58 year old male presents for an annual physical exam.  Amendable to Tdap today. Colonoscopy up to date. Needs routine labs today.  Reports occasional issues with blood with wiping.  He has occasional constipation.  He is concerned that he may have hemorrhoids.  He is otherwise doing well.  Has no other complaints or concerns at this time.  Patient Active Problem List   Diagnosis Date Noted   Gastroesophageal reflux disease without esophagitis 05/07/2022   Annual physical exam 05/07/2022   Hyperlipidemia 03/29/2022   Prediabetes 06/20/2018    Social Hx   Social History   Socioeconomic History   Marital status: Married    Spouse name: Not on file   Number of children: Not on file   Years of education: Not on file   Highest education level: Some college, no degree  Occupational History   Not on file  Tobacco Use   Smoking status: Light Smoker    Types: Cigars   Smokeless tobacco: Never  Vaping Use   Vaping status: Never Used  Substance and Sexual Activity   Alcohol use: Yes    Alcohol/week: 0.0 standard drinks of alcohol    Comment: glass of wine on occasion   Drug use: No   Sexual activity: Not on file  Other Topics Concern   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   Social Drivers of Corporate investment banker Strain: Low Risk  (06/04/2024)   Overall Financial Resource Strain (CARDIA)    Difficulty of Paying Living Expenses: Not hard at all  Food Insecurity: No Food Insecurity (06/04/2024)   Hunger Vital Sign    Worried About Running Out of Food in the Last Year: Never true    Ran Out of Food in the Last Year: Never true  Transportation Needs: No Transportation Needs (06/04/2024)   PRAPARE - Administrator, Civil Service (Medical):  No    Lack of Transportation (Non-Medical): No  Physical Activity: Insufficiently Active (06/04/2024)   Exercise Vital Sign    Days of Exercise per Week: 2 days    Minutes of Exercise per Session: 20 min  Stress: No Stress Concern Present (06/04/2024)   [employer] of Occupational Health - Occupational Stress Questionnaire    Feeling of Stress: Only a little  Social Connections: Moderately Integrated (06/04/2024)   Social Connection and Isolation Panel    Frequency of Communication with Friends and Family: Once a week    Frequency of Social Gatherings with Friends and Family: Once a week    Attends Religious Services: More than 4 times per year    Active Member of [employer] or Organizations: Yes    Attends Engineer, structural: More than 4 times per year    Marital Status: Married    Review of Systems Per HPI  Objective:  BP 121/78   Pulse 96   Ht 5' 6.75 (1.695 m)   Wt 212 lb (96.2 kg)   SpO2 96%   BMI 33.45 kg/m      06/04/2024    8:35 AM 05/18/2024    1:35 PM 06/02/2023    9:03 AM  BP/Weight  Systolic BP 121 117 119  Diastolic BP 78 75 79  Wt. (Lbs) 212 217 206  BMI 33.45 kg/m2 34.24 kg/m2 32.51 kg/m2    Physical Exam Vitals and nursing note reviewed.  Constitutional:      General: He is not in acute distress.    Appearance: Normal appearance.  HENT:     Head: Normocephalic and atraumatic.     Mouth/Throat:     Pharynx: Oropharynx is clear.  Eyes:     General:        Right eye: No discharge.        Left eye: No discharge.     Conjunctiva/sclera: Conjunctivae normal.  Cardiovascular:     Rate and Rhythm: Normal rate and regular rhythm.  Pulmonary:     Effort: Pulmonary effort is normal.     Breath sounds: Normal breath sounds. No wheezing, rhonchi or rales.  Abdominal:     General: There is no distension.     Palpations: Abdomen is soft.     Tenderness: There is no abdominal tenderness.  Genitourinary:    Rectum: No anal fissure or external  hemorrhoid.     Comments: Skin tag from prior external hemorrhoid. Neurological:     General: No focal deficit present.     Mental Status: He is alert.  Psychiatric:        Mood and Affect: Mood normal.        Behavior: Behavior normal.     Lab Results  Component Value Date   WBC 5.2 05/30/2023   HGB 14.5 05/30/2023   HCT 43.2 05/30/2023   PLT 244 05/30/2023   GLUCOSE 103 (H) 05/30/2023   CHOL 202 (H) 05/30/2023   TRIG 121 05/30/2023   HDL 41 05/30/2023   LDLCALC 139 (H) 05/30/2023   ALT 22 05/30/2023   AST 17 05/30/2023   NA 135 05/30/2023   K 4.8 05/30/2023   CL 103 05/30/2023   CREATININE 0.96 05/30/2023   BUN 11 05/30/2023   CO2 20 05/30/2023   PSA 0.82 05/19/2013   HGBA1C 5.9 (H) 05/30/2023     Assessment & Plan:  Annual physical exam Assessment & Plan: Tdap given today.  Labs ordered.  Healthcare maintenance section updated.   Prediabetes -     CMP14+EGFR -     Hemoglobin A1c  Hyperlipidemia, unspecified hyperlipidemia type -     Lipid panel -     Rosuvastatin  Calcium ; Take 1 tablet (10 mg total) by mouth daily.  Dispense: 90 tablet; Refill: 3  Screening for deficiency anemia -     CBC  Screening PSA (prostate specific antigen) -     PSA  Gastroesophageal reflux disease without esophagitis -     Omeprazole ; Take 1 capsule (20 mg total) by mouth daily as needed.  Dispense: 90 capsule; Refill: 3  Need for diphtheria-tetanus-pertussis (Tdap) vaccine -     Tdap vaccine greater than or equal to 7yo IM    Follow-up:  Annually  Jacqulyn Ahle DO [facility]

## 2024-06-04 NOTE — Assessment & Plan Note (Signed)
 Tdap given today.  Labs ordered.  Healthcare maintenance section updated.

## 2024-06-22 LAB — HEMOGLOBIN A1C
Est. average glucose Bld gHb Est-mCnc: 126 mg/dL
Hgb A1c MFr Bld: 6 % — ABNORMAL HIGH (ref 4.8–5.6)

## 2024-06-22 LAB — CMP14+EGFR
ALT: 28 IU/L (ref 0–44)
AST: 23 IU/L (ref 0–40)
Albumin: 4.4 g/dL (ref 3.8–4.9)
Alkaline Phosphatase: 80 IU/L (ref 47–123)
BUN/Creatinine Ratio: 12 (ref 9–20)
BUN: 13 mg/dL (ref 6–24)
Bilirubin Total: 0.6 mg/dL (ref 0.0–1.2)
CO2: 23 mmol/L (ref 20–29)
Calcium: 9.4 mg/dL (ref 8.7–10.2)
Chloride: 102 mmol/L (ref 96–106)
Creatinine, Ser: 1.08 mg/dL (ref 0.76–1.27)
Globulin, Total: 2.9 g/dL (ref 1.5–4.5)
Glucose: 103 mg/dL — ABNORMAL HIGH (ref 70–99)
Potassium: 4.7 mmol/L (ref 3.5–5.2)
Sodium: 138 mmol/L (ref 134–144)
Total Protein: 7.3 g/dL (ref 6.0–8.5)
eGFR: 81 mL/min/1.73 (ref >59)

## 2024-06-22 LAB — CBC
Hematocrit: 45.7 % (ref 37.5–51.0)
Hemoglobin: 15.1 g/dL (ref 13.0–17.7)
MCH: 32.5 pg (ref 26.6–33.0)
MCHC: 33 g/dL (ref 31.5–35.7)
MCV: 99 fL — ABNORMAL HIGH (ref 79–97)
Platelets: 219 x10E3/uL (ref 150–450)
RBC: 4.64 x10E6/uL (ref 4.14–5.80)
RDW: 12.2 % (ref 11.6–15.4)
WBC: 5.6 x10E3/uL (ref 3.4–10.8)

## 2024-06-22 LAB — LIPID PANEL
Chol/HDL Ratio: 3.7 ratio (ref 0.0–5.0)
Cholesterol, Total: 150 mg/dL (ref 100–199)
HDL: 41 mg/dL (ref >39)
LDL Chol Calc (NIH): 90 mg/dL (ref 0–99)
Triglycerides: 102 mg/dL (ref 0–149)
VLDL Cholesterol Cal: 19 mg/dL (ref 5–40)

## 2024-06-22 LAB — PSA: Prostate Specific Ag, Serum: 0.9 ng/mL (ref 0.0–4.0)

## 2024-06-24 ENCOUNTER — Ambulatory Visit: Payer: Self-pay | Admitting: Family Medicine
